# Patient Record
Sex: Male | Born: 1980 | Race: Black or African American | Hispanic: No | Marital: Single | Smoking: Current some day smoker
Health system: Southern US, Community
[De-identification: ages and names within clinical notes are randomized; demographics above are authoritative.]

## PROBLEM LIST (undated history)

## (undated) DIAGNOSIS — G4730 Sleep apnea, unspecified: Secondary | ICD-10-CM

## (undated) DIAGNOSIS — E669 Obesity, unspecified: Secondary | ICD-10-CM

## (undated) DIAGNOSIS — G47419 Narcolepsy without cataplexy: Secondary | ICD-10-CM

## (undated) HISTORY — PX: NO PAST SURGERIES: SHX2092

---

## 2000-03-31 ENCOUNTER — Emergency Department (HOSPITAL_COMMUNITY): Admission: EM | Admit: 2000-03-31 | Discharge: 2000-03-31 | Payer: Self-pay | Admitting: *Deleted

## 2000-04-03 ENCOUNTER — Emergency Department (HOSPITAL_COMMUNITY): Admission: EM | Admit: 2000-04-03 | Discharge: 2000-04-03 | Payer: Self-pay | Admitting: Emergency Medicine

## 2001-01-16 ENCOUNTER — Encounter: Payer: Self-pay | Admitting: Emergency Medicine

## 2001-01-16 ENCOUNTER — Emergency Department (HOSPITAL_COMMUNITY): Admission: EM | Admit: 2001-01-16 | Discharge: 2001-01-16 | Payer: Self-pay | Admitting: Emergency Medicine

## 2004-06-10 ENCOUNTER — Emergency Department (HOSPITAL_COMMUNITY): Admission: EM | Admit: 2004-06-10 | Discharge: 2004-06-10 | Payer: Self-pay | Admitting: *Deleted

## 2005-01-07 ENCOUNTER — Emergency Department (HOSPITAL_COMMUNITY): Admission: EM | Admit: 2005-01-07 | Discharge: 2005-01-07 | Payer: Self-pay | Admitting: Emergency Medicine

## 2005-02-27 ENCOUNTER — Emergency Department (HOSPITAL_COMMUNITY): Admission: EM | Admit: 2005-02-27 | Discharge: 2005-02-27 | Payer: Self-pay | Admitting: Emergency Medicine

## 2006-12-24 ENCOUNTER — Emergency Department (HOSPITAL_COMMUNITY): Admission: EM | Admit: 2006-12-24 | Discharge: 2006-12-24 | Payer: Self-pay | Admitting: Family Medicine

## 2007-02-23 ENCOUNTER — Emergency Department (HOSPITAL_COMMUNITY): Admission: EM | Admit: 2007-02-23 | Discharge: 2007-02-23 | Payer: Self-pay | Admitting: Family Medicine

## 2007-05-25 ENCOUNTER — Emergency Department (HOSPITAL_COMMUNITY): Admission: EM | Admit: 2007-05-25 | Discharge: 2007-05-25 | Payer: Self-pay | Admitting: Emergency Medicine

## 2007-08-27 ENCOUNTER — Emergency Department (HOSPITAL_COMMUNITY): Admission: EM | Admit: 2007-08-27 | Discharge: 2007-08-27 | Payer: Self-pay | Admitting: Emergency Medicine

## 2007-12-17 ENCOUNTER — Emergency Department (HOSPITAL_COMMUNITY): Admission: EM | Admit: 2007-12-17 | Discharge: 2007-12-17 | Payer: Self-pay | Admitting: Emergency Medicine

## 2008-02-01 ENCOUNTER — Encounter: Admission: RE | Admit: 2008-02-01 | Discharge: 2008-02-01 | Payer: Self-pay | Admitting: Orthopedic Surgery

## 2009-04-03 ENCOUNTER — Emergency Department (HOSPITAL_COMMUNITY): Admission: EM | Admit: 2009-04-03 | Discharge: 2009-04-03 | Payer: Self-pay | Admitting: Family Medicine

## 2010-12-02 ENCOUNTER — Encounter: Payer: Self-pay | Admitting: Orthopedic Surgery

## 2014-08-16 ENCOUNTER — Ambulatory Visit: Payer: Self-pay

## 2015-04-03 ENCOUNTER — Emergency Department (HOSPITAL_COMMUNITY)

## 2015-04-03 ENCOUNTER — Encounter (HOSPITAL_COMMUNITY): Payer: Self-pay | Admitting: Emergency Medicine

## 2015-04-03 ENCOUNTER — Emergency Department (HOSPITAL_COMMUNITY)
Admission: EM | Admit: 2015-04-03 | Discharge: 2015-04-03 | Disposition: A | Discharge status: Home / Self Care | Attending: Emergency Medicine | Admitting: Emergency Medicine

## 2015-04-03 DIAGNOSIS — E669 Obesity, unspecified: Secondary | ICD-10-CM | POA: Diagnosis not present

## 2015-04-03 DIAGNOSIS — G4730 Sleep apnea, unspecified: Secondary | ICD-10-CM

## 2015-04-03 HISTORY — DX: Sleep apnea, unspecified: G47.30

## 2015-04-03 LAB — CBC WITH DIFFERENTIAL/PLATELET
BASOS ABS: 0.1 10*3/uL (ref 0.0–0.1)
BASOS PCT: 1 % (ref 0–1)
EOS PCT: 2 % (ref 0–5)
Eosinophils Absolute: 0.1 10*3/uL (ref 0.0–0.7)
HEMATOCRIT: 45.7 % (ref 39.0–52.0)
Hemoglobin: 14.7 g/dL (ref 13.0–17.0)
Lymphocytes Relative: 33 % (ref 12–46)
Lymphs Abs: 1.6 10*3/uL (ref 0.7–4.0)
MCH: 29.3 pg (ref 26.0–34.0)
MCHC: 32.2 g/dL (ref 30.0–36.0)
MCV: 91 fL (ref 78.0–100.0)
Monocytes Absolute: 0.4 10*3/uL (ref 0.1–1.0)
Monocytes Relative: 9 % (ref 3–12)
NEUTROS PCT: 55 % (ref 43–77)
Neutro Abs: 2.7 10*3/uL (ref 1.7–7.7)
Platelets: 272 10*3/uL (ref 150–400)
RBC: 5.02 MIL/uL (ref 4.22–5.81)
RDW: 14.4 % (ref 11.5–15.5)
WBC: 4.8 10*3/uL (ref 4.0–10.5)

## 2015-04-03 LAB — COMPREHENSIVE METABOLIC PANEL
ALBUMIN: 4.1 g/dL (ref 3.5–5.0)
ALT: 15 U/L — AB (ref 17–63)
ANION GAP: 7 (ref 5–15)
AST: 18 U/L (ref 15–41)
Alkaline Phosphatase: 49 U/L (ref 38–126)
BUN: 13 mg/dL (ref 6–20)
CALCIUM: 9.2 mg/dL (ref 8.9–10.3)
CHLORIDE: 101 mmol/L (ref 101–111)
CO2: 32 mmol/L (ref 22–32)
CREATININE: 1.17 mg/dL (ref 0.61–1.24)
GFR calc non Af Amer: >60 mL/min (ref >60)
Glucose, Bld: 89 mg/dL (ref 65–99)
Potassium: 4.1 mmol/L (ref 3.5–5.1)
SODIUM: 140 mmol/L (ref 135–145)
TOTAL PROTEIN: 7.9 g/dL (ref 6.5–8.1)
Total Bilirubin: 0.5 mg/dL (ref 0.3–1.2)

## 2015-04-03 LAB — URINALYSIS, ROUTINE W REFLEX MICROSCOPIC
GLUCOSE, UA: NEGATIVE mg/dL
Hgb urine dipstick: NEGATIVE
Ketones, ur: NEGATIVE mg/dL
Leukocytes, UA: NEGATIVE
Nitrite: NEGATIVE
Protein, ur: NEGATIVE mg/dL
Specific Gravity, Urine: 1.028 (ref 1.005–1.030)
UROBILINOGEN UA: 1 mg/dL (ref 0.0–1.0)
pH: 6 (ref 5.0–8.0)

## 2015-04-03 LAB — RAPID URINE DRUG SCREEN, HOSP PERFORMED
AMPHETAMINES: NOT DETECTED
BENZODIAZEPINES: NOT DETECTED
Barbiturates: NOT DETECTED
Cocaine: POSITIVE — AB
OPIATES: NOT DETECTED
Tetrahydrocannabinol: NOT DETECTED

## 2015-04-03 LAB — ETHANOL: Alcohol, Ethyl (B): <5 mg/dL (ref <5)

## 2015-04-03 MED ORDER — NALOXONE HCL 0.4 MG/ML IJ SOLN
0.2000 mg | Freq: Once | INTRAMUSCULAR | Status: DC
  Filled 2015-04-03: qty 1

## 2015-04-03 NOTE — ED Provider Notes (Signed)
CSN: [id number]     Arrival date & time 04/03/15  1211 History   First MD Initiated Contact with Patient 04/03/15 1310     Chief Complaint  Patient presents with  . Sleep Apnea     (Consider location/radiation/quality/duration/timing/severity/associated sxs/prior Treatment) The history is provided by the patient.   patient presents with decreased responsiveness. Began after sleeping. Patient states he has a history of sleep apnea and has been off his Cpap for a couple years. He is also put on 130 pounds the last few years. He does snore. He states this is typical for him because he gets sleepy because he does not sleep much. He denies substance abuse. Denies headache. States he previously had C Pap and finished up 2 years ago. He states he has been in jail and that's when he was diagnosed with it. Her only in jail on the weekends. Does not primary care doctor.  Past Medical History  Diagnosis Date  . Sleep apnea    History reviewed. No pertinent past surgical history. No family history on file. History  Substance Use Topics  . Smoking status: Not on file  . Smokeless tobacco: Not on file  . Alcohol Use: Not on file    Review of Systems  Constitutional: Negative for activity change and appetite change.  Respiratory: Negative for shortness of breath.   Gastrointestinal: Negative for abdominal pain.  Musculoskeletal: Negative for back pain.  Skin: Negative for wound.  Neurological: Negative for speech difficulty and light-headedness.  Hematological: Does not bruise/bleed easily.  Psychiatric/Behavioral: Negative for agitation.      Allergies  Review of patient's allergies indicates no known allergies.  Home Medications   Prior to Admission medications   Not on File   BP 122/63 mmHg  Pulse 79  Temp(Src) 98 F (36.7 C) (Oral)  Resp 21  SpO2 97% Physical Exam  Constitutional: He is oriented to person, place, and time. He appears well-developed and well-nourished.   Patient is obese  HENT:  Head: Atraumatic.  Eyes: EOM are normal.  Neck: Neck supple.  Cardiovascular: Normal rate.   Pulmonary/Chest: Effort normal. He exhibits no tenderness.  Abdominal: Soft. There is no tenderness.  Musculoskeletal: Normal range of motion.  Neurological: He is alert and oriented to person, place, and time.  Patient is initially sleepy but does arouse. He will quickly go back to sleep. Pupils initially somewhat constricted but improved on recheck.    ED Course  Procedures (including critical care time) Labs Review Labs Reviewed  COMPREHENSIVE METABOLIC PANEL - Abnormal; Notable for the following:    ALT 15 (*)    All other components within normal limits  URINE RAPID DRUG SCREEN (HOSP PERFORMED) - Abnormal; Notable for the following:    Cocaine POSITIVE (*)    All other components within normal limits  URINALYSIS, ROUTINE W REFLEX MICROSCOPIC - Abnormal; Notable for the following:    Color, Urine AMBER (*)    Bilirubin Urine SMALL (*)    All other components within normal limits  ETHANOL  CBC WITH DIFFERENTIAL/PLATELET    Imaging Review Dg Chest 2 View  04/03/2015   CLINICAL DATA:  Unresponsiveness when staff attempted to rows from sleep, currently asymptomatic, active smoker  EXAM: CHEST  2 VIEW  COMPARISON:  None.  FINDINGS: The lungs are mildly hypoinflated but clear. The cardiac silhouette is top-normal in size. The pulmonary vascularity is not engorged. There is no pleural effusion. The trachea is midline. The bony thorax exhibits no acute  abnormality.  IMPRESSION: There is no active cardiopulmonary disease.   Electronically Signed   By: David  SwazilandJordan M.D.   On: 04/03/2015 14:48     EKG Interpretation None      MDM   Final diagnoses:  Sleep apnea    Patient with decreased responsiveness. Does awake. Likely secondary to his sleep apnea. Urinalysis only showed cocaine. No focal findings. Pupils initially somewhat constricted but when he woke up  they had returned to normal. Discuss with pulmonary. Will need outpatient workup and sleep study. Case management seen and gave some follow-up resources. Patient does not have his own PCP. He is currently staying in jail on Sunday and Monday night. Will discharge back to jail.    Benjiman CoreNathan Teigen Parslow, MD 04/03/15 [phone]

## 2015-04-03 NOTE — ED Notes (Signed)
Per ems pt from jail, per staff pt was unresponsive while sleeping . Per ems pt was sitting in chair upon arrival, with non-breather mask. O2 sat on RA 98%. Pt is no distress , denies any symptoms. No chest pain nor sob. Pt is lalert and oriented x4.

## 2015-04-03 NOTE — ED Notes (Signed)
Bed: WA02 Expected date:  Expected time:  Means of arrival:  Comments: "unresponsive" from jail (A&O x 4)

## 2015-04-03 NOTE — Discharge Instructions (Signed)
Sleep Apnea  Sleep apnea is a sleep disorder characterized by abnormal pauses in breathing while you sleep. When your breathing pauses, the level of oxygen in your blood decreases. This causes you to move out of deep sleep and into light sleep. As a result, your quality of sleep is poor, and the system that carries your blood throughout your body (cardiovascular system) experiences stress. If sleep apnea remains untreated, the following conditions can develop:  High blood pressure (hypertension).  Coronary artery disease.  Inability to achieve or maintain an erection (impotence).  Impairment of your thought process (cognitive dysfunction). There are three types of sleep apnea: 1. Obstructive sleep apnea--Pauses in breathing during sleep because of a blocked airway. 2. Central sleep apnea--Pauses in breathing during sleep because the area of the brain that controls your breathing does not send the correct signals to the muscles that control breathing. 3. Mixed sleep apnea--A combination of both obstructive and central sleep apnea. RISK FACTORS The following risk factors can increase your risk of developing sleep apnea:  Being overweight.  Smoking.  Having narrow passages in your nose and throat.  Being of older age.  Being male.  Alcohol use.  Sedative and tranquilizer use.  Ethnicity. Among individuals younger than 35 years, African Americans are at increased risk of sleep apnea. SYMPTOMS   Difficulty staying asleep.  Daytime sleepiness and fatigue.  Loss of energy.  Irritability.  Loud, heavy snoring.  Morning headaches.  Trouble concentrating.  Forgetfulness.  Decreased interest in sex. DIAGNOSIS  In order to diagnose sleep apnea, your caregiver will perform a physical examination. Your caregiver may suggest that you take a home sleep test. Your caregiver may also recommend that you spend the night in a sleep lab. In the sleep lab, several monitors record  information about your heart, lungs, and brain while you sleep. Your leg and arm movements and blood oxygen level are also recorded. TREATMENT The following actions may help to resolve mild sleep apnea:  Sleeping on your side.   Using a decongestant if you have nasal congestion.   Avoiding the use of depressants, including alcohol, sedatives, and narcotics.   Losing weight and modifying your diet if you are overweight. There also are devices and treatments to help open your airway:  Oral appliances. These are custom-made mouthpieces that shift your lower jaw forward and slightly open your bite. This opens your airway.  Devices that create positive airway pressure. This positive pressure "splints" your airway open to help you breathe better during sleep. The following devices create positive airway pressure:  Continuous positive airway pressure (CPAP) device. The CPAP device creates a continuous level of air pressure with an air pump. The air is delivered to your airway through a mask while you sleep. This continuous pressure keeps your airway open.  Nasal expiratory positive airway pressure (EPAP) device. The EPAP device creates positive air pressure as you exhale. The device consists of single-use valves, which are inserted into each nostril and held in place by adhesive. The valves create very little resistance when you inhale but create much more resistance when you exhale. That increased resistance creates the positive airway pressure. This positive pressure while you exhale keeps your airway open, making it easier to breath when you inhale again.  Bilevel positive airway pressure (BPAP) device. The BPAP device is used mainly in patients with central sleep apnea. This device is similar to the CPAP device because it also uses an air pump to deliver continuous air pressure   through a mask. However, with the BPAP machine, the pressure is set at two different levels. The pressure when you  exhale is lower than the pressure when you inhale.  Surgery. Typically, surgery is only done if you cannot comply with less invasive treatments or if the less invasive treatments do not improve your condition. Surgery involves removing excess tissue in your airway to create a wider passage way. Document Released: 10/18/2002 Document Revised: 02/22/2013 Document Reviewed: 03/05/2012 ExitCare Patient Information 2015 [employer]. This information is not intended to replace advice given to you by your health care provider. Make sure you discuss any questions you have with your health care provider.  

## 2015-04-03 NOTE — Progress Notes (Addendum)
wl ed cm consulted by Dr Rubin PayorPickering to see pt to assist with giving self pay/uninsured resources so when this 34 yr old male [place] and use of jail cpap he can obtain a pcp to get medical care and referral from a pcp to get a sleep study completed to get a cpap if needed. Cm provided pt with self pay/uninsured [place] resources.  Discussed he will need to get a pcp, pcp referral for another sleep study and then get a new cpap Pt voiced understanding Pt confirmed in 2010 he had a sleep study done during another prison term and was given a cpap to use only during that prison stay. Pt was not given that cpap to use outside of prison.   CM spoke with pt who [place].  CM discussed and provided written information for self pay pcps, discussed the importance of pcp vs EDP services for f/u care, www.needymeds.org, www.goodrx.com, discounted pharmacies and other [place] resources such as [facility] , [facility], affordable care act,  [place] [facility] assist, financial assistance, self pay dental services, [place] med assist, DSS and  health department  Reviewed resources for [place] self pay pcps like [facility], family medicine at [address], [facility], [facility], local urgent care centers, [facility], [facility], general medical clinics, [facility] services of the [place], [facility] urgent [facility] plus others, medication resources, [facility] out patient pharmacies and housing Pt voiced understanding and appreciation of resources provided   Provided [facility] contact information Pt agreed to [facility] referral Conf[place], has not had an orange card but knows someone who does.  Pt dozing in and out during Cm interventions.  Officer at doorway Pt confirms he is to be released from jail "tomorrow"  Encouraged pt to go to one of the listed self pay providers listed to  include [facility] until he is possibly approved for orange card program  Pt c/o right occipital iv site pain ED RN notified

## 2015-07-27 ENCOUNTER — Inpatient Hospital Stay (HOSPITAL_COMMUNITY): Payer: Medicaid Other

## 2015-07-27 ENCOUNTER — Emergency Department (HOSPITAL_COMMUNITY): Payer: Medicaid Other

## 2015-07-27 ENCOUNTER — Inpatient Hospital Stay (HOSPITAL_COMMUNITY)
Admission: EM | Admit: 2015-07-27 | Discharge: 2015-08-01 | DRG: 208 | Disposition: A | Discharge status: Home / Self Care | Payer: Medicaid Other | Attending: Emergency Medicine | Admitting: Emergency Medicine

## 2015-07-27 DIAGNOSIS — J9622 Acute and chronic respiratory failure with hypercapnia: Secondary | ICD-10-CM | POA: Diagnosis present

## 2015-07-27 DIAGNOSIS — E874 Mixed disorder of acid-base balance: Secondary | ICD-10-CM | POA: Diagnosis present

## 2015-07-27 DIAGNOSIS — J9612 Chronic respiratory failure with hypercapnia: Secondary | ICD-10-CM | POA: Insufficient documentation

## 2015-07-27 DIAGNOSIS — N179 Acute kidney failure, unspecified: Secondary | ICD-10-CM | POA: Diagnosis not present

## 2015-07-27 DIAGNOSIS — G47419 Narcolepsy without cataplexy: Secondary | ICD-10-CM | POA: Diagnosis present

## 2015-07-27 DIAGNOSIS — I272 Other secondary pulmonary hypertension: Secondary | ICD-10-CM | POA: Diagnosis present

## 2015-07-27 DIAGNOSIS — R5383 Other fatigue: Secondary | ICD-10-CM | POA: Diagnosis present

## 2015-07-27 DIAGNOSIS — R739 Hyperglycemia, unspecified: Secondary | ICD-10-CM | POA: Diagnosis present

## 2015-07-27 DIAGNOSIS — E7220 Disorder of urea cycle metabolism, unspecified: Secondary | ICD-10-CM | POA: Diagnosis present

## 2015-07-27 DIAGNOSIS — J9621 Acute and chronic respiratory failure with hypoxia: Secondary | ICD-10-CM | POA: Diagnosis present

## 2015-07-27 DIAGNOSIS — Z6841 Body Mass Index (BMI) 40.0 and over, adult: Secondary | ICD-10-CM | POA: Diagnosis not present

## 2015-07-27 DIAGNOSIS — G4733 Obstructive sleep apnea (adult) (pediatric): Secondary | ICD-10-CM | POA: Diagnosis present

## 2015-07-27 DIAGNOSIS — F1410 Cocaine abuse, uncomplicated: Secondary | ICD-10-CM | POA: Diagnosis present

## 2015-07-27 DIAGNOSIS — G9340 Encephalopathy, unspecified: Secondary | ICD-10-CM | POA: Diagnosis present

## 2015-07-27 DIAGNOSIS — I10 Essential (primary) hypertension: Secondary | ICD-10-CM | POA: Diagnosis present

## 2015-07-27 DIAGNOSIS — R945 Abnormal results of liver function studies: Secondary | ICD-10-CM

## 2015-07-27 DIAGNOSIS — I491 Atrial premature depolarization: Secondary | ICD-10-CM | POA: Diagnosis present

## 2015-07-27 DIAGNOSIS — J9600 Acute respiratory failure, unspecified whether with hypoxia or hypercapnia: Secondary | ICD-10-CM

## 2015-07-27 DIAGNOSIS — E662 Morbid (severe) obesity with alveolar hypoventilation: Secondary | ICD-10-CM | POA: Diagnosis present

## 2015-07-27 DIAGNOSIS — E876 Hypokalemia: Secondary | ICD-10-CM | POA: Diagnosis not present

## 2015-07-27 DIAGNOSIS — J9620 Acute and chronic respiratory failure, unspecified whether with hypoxia or hypercapnia: Secondary | ICD-10-CM

## 2015-07-27 DIAGNOSIS — R7989 Other specified abnormal findings of blood chemistry: Secondary | ICD-10-CM

## 2015-07-27 LAB — I-STAT ARTERIAL BLOOD GAS, ED
ACID-BASE EXCESS: 2 mmol/L (ref 0.0–2.0)
ACID-BASE EXCESS: 4 mmol/L — AB (ref 0.0–2.0)
BICARBONATE: 33 meq/L — AB (ref 20.0–24.0)
Bicarbonate: 29.8 mEq/L — ABNORMAL HIGH (ref 20.0–24.0)
O2 SAT: 94 %
O2 SAT: 100 %
PCO2 ART: 56.7 mmHg — AB (ref 35.0–45.0)
PH ART: 7.33 — AB (ref 7.350–7.450)
Patient temperature: 98.6
TCO2: 32 mmol/L (ref 0–100)
TCO2: 35 mmol/L (ref 0–100)
pCO2 arterial: 68.9 mmHg (ref 35.0–45.0)
pH, Arterial: 7.288 — ABNORMAL LOW (ref 7.350–7.450)
pO2, Arterial: 77 mmHg — ABNORMAL LOW (ref 80.0–100.0)
pO2, Arterial: 382 mmHg — ABNORMAL HIGH (ref 80.0–100.0)

## 2015-07-27 LAB — RAPID URINE DRUG SCREEN, HOSP PERFORMED
AMPHETAMINES: NOT DETECTED
Barbiturates: NOT DETECTED
Benzodiazepines: POSITIVE — AB
Cocaine: POSITIVE — AB
OPIATES: NOT DETECTED
Tetrahydrocannabinol: NOT DETECTED

## 2015-07-27 LAB — CBC
HCT: 43.9 % (ref 39.0–52.0)
HEMOGLOBIN: 14.7 g/dL (ref 13.0–17.0)
MCH: 29.9 pg (ref 26.0–34.0)
MCHC: 33.5 g/dL (ref 30.0–36.0)
MCV: 89.4 fL (ref 78.0–100.0)
PLATELETS: 262 10*3/uL (ref 150–400)
RBC: 4.91 MIL/uL (ref 4.22–5.81)
RDW: 13.5 % (ref 11.5–15.5)
WBC: 4.1 10*3/uL (ref 4.0–10.5)

## 2015-07-27 LAB — CBC WITH DIFFERENTIAL/PLATELET
BASOS ABS: 0 10*3/uL (ref 0.0–0.1)
BASOS PCT: 1 %
EOS ABS: 0.2 10*3/uL (ref 0.0–0.7)
Eosinophils Relative: 3 %
HEMATOCRIT: 45.6 % (ref 39.0–52.0)
HEMOGLOBIN: 15 g/dL (ref 13.0–17.0)
Lymphocytes Relative: 36 %
Lymphs Abs: 1.9 10*3/uL (ref 0.7–4.0)
MCH: 29.8 pg (ref 26.0–34.0)
MCHC: 32.9 g/dL (ref 30.0–36.0)
MCV: 90.7 fL (ref 78.0–100.0)
MONOS PCT: 11 %
Monocytes Absolute: 0.6 10*3/uL (ref 0.1–1.0)
NEUTROS ABS: 2.6 10*3/uL (ref 1.7–7.7)
NEUTROS PCT: 49 %
Platelets: 274 10*3/uL (ref 150–400)
RBC: 5.03 MIL/uL (ref 4.22–5.81)
RDW: 13.8 % (ref 11.5–15.5)
WBC: 5.3 10*3/uL (ref 4.0–10.5)

## 2015-07-27 LAB — COMPREHENSIVE METABOLIC PANEL
ALBUMIN: 4 g/dL (ref 3.5–5.0)
ALK PHOS: 45 U/L (ref 38–126)
ALT: 18 U/L (ref 17–63)
ANION GAP: 7 (ref 5–15)
AST: 24 U/L (ref 15–41)
BILIRUBIN TOTAL: 0.5 mg/dL (ref 0.3–1.2)
BUN: 6 mg/dL (ref 6–20)
CALCIUM: 9.1 mg/dL (ref 8.9–10.3)
CO2: 28 mmol/L (ref 22–32)
CREATININE: 1.03 mg/dL (ref 0.61–1.24)
Chloride: 105 mmol/L (ref 101–111)
GFR calc Af Amer: >60 mL/min (ref >60)
GFR calc non Af Amer: >60 mL/min (ref >60)
GLUCOSE: 120 mg/dL — AB (ref 65–99)
Potassium: 3.8 mmol/L (ref 3.5–5.1)
Sodium: 140 mmol/L (ref 135–145)
TOTAL PROTEIN: 7.1 g/dL (ref 6.5–8.1)

## 2015-07-27 LAB — CBG MONITORING, ED
GLUCOSE-CAPILLARY: 121 mg/dL — AB (ref 65–99)
GLUCOSE-CAPILLARY: 129 mg/dL — AB (ref 65–99)

## 2015-07-27 LAB — MRSA PCR SCREENING: MRSA by PCR: NEGATIVE

## 2015-07-27 LAB — ETHANOL

## 2015-07-27 LAB — LIPASE, BLOOD: LIPASE: 26 U/L (ref 22–51)

## 2015-07-27 LAB — TROPONIN I
Troponin I: <0.03 ng/mL (ref <0.031)
Troponin I: <0.03 ng/mL (ref <0.031)

## 2015-07-27 LAB — TRIGLYCERIDES: TRIGLYCERIDES: 182 mg/dL — AB (ref <150)

## 2015-07-27 LAB — AMMONIA: AMMONIA: 51 umol/L — AB (ref 9–35)

## 2015-07-27 MED ORDER — HEPARIN SODIUM (PORCINE) 5000 UNIT/ML IJ SOLN
5000.0000 [IU] | Freq: Three times a day (TID) | INTRAMUSCULAR | Status: DC
  Administered 2015-07-27 – 2015-07-30 (×10): 5000 [IU] via SUBCUTANEOUS
  Filled 2015-07-27 (×13): qty 1

## 2015-07-27 MED ORDER — SODIUM CHLORIDE 0.9 % IV SOLN: 250.0000 mL | INTRAVENOUS | Status: DC | PRN

## 2015-07-27 MED ORDER — ANTISEPTIC ORAL RINSE SOLUTION (CORINZ)
7.0000 mL | Freq: Four times a day (QID) | OROMUCOSAL | Status: DC
  Administered 2015-07-27 – 2015-07-28 (×5): 7 mL via OROMUCOSAL

## 2015-07-27 MED ORDER — LACTULOSE 10 GM/15ML PO SOLN
30.0000 g | Freq: Two times a day (BID) | ORAL | Status: DC
  Administered 2015-07-27 – 2015-07-30 (×8): 30 g via ORAL
  Filled 2015-07-27 (×8): qty 45

## 2015-07-27 MED ORDER — ETOMIDATE 2 MG/ML IV SOLN
20.0000 mg | Freq: Once | INTRAVENOUS | Status: AC
  Administered 2015-07-27: 20 mg via INTRAVENOUS

## 2015-07-27 MED ORDER — ALBUTEROL SULFATE (2.5 MG/3ML) 0.083% IN NEBU: 2.5000 mg | INHALATION_SOLUTION | RESPIRATORY_TRACT | Status: DC | PRN

## 2015-07-27 MED ORDER — PROPOFOL 1000 MG/100ML IV EMUL
0.0000 ug/kg/min | INTRAVENOUS | Status: DC
  Administered 2015-07-27: 50 ug/kg/min via INTRAVENOUS
  Administered 2015-07-27: 45 ug/kg/min via INTRAVENOUS
  Administered 2015-07-27 – 2015-07-28 (×4): 50 ug/kg/min via INTRAVENOUS
  Administered 2015-07-28 (×2): 45 ug/kg/min via INTRAVENOUS
  Filled 2015-07-27 (×8): qty 100

## 2015-07-27 MED ORDER — SODIUM CHLORIDE 0.9 % IV SOLN: INTRAVENOUS | Status: DC

## 2015-07-27 MED ORDER — PROPOFOL 1000 MG/100ML IV EMUL
0.0000 ug/kg/min | INTRAVENOUS | Status: DC
  Administered 2015-07-27: 20 ug/kg/min via INTRAVENOUS

## 2015-07-27 MED ORDER — CHLORHEXIDINE GLUCONATE 0.12% ORAL RINSE (MEDLINE KIT)
15.0000 mL | Freq: Two times a day (BID) | OROMUCOSAL | Status: DC
Start: 2015-07-27 — End: 2015-07-28
  Administered 2015-07-27 – 2015-07-28 (×2): 15 mL via OROMUCOSAL

## 2015-07-27 MED ORDER — FENTANYL CITRATE (PF) 100 MCG/2ML IJ SOLN
INTRAMUSCULAR | Status: AC
  Filled 2015-07-27: qty 2

## 2015-07-27 MED ORDER — MIDAZOLAM HCL 2 MG/2ML IJ SOLN
INTRAMUSCULAR | Status: AC
  Filled 2015-07-27: qty 2

## 2015-07-27 MED ORDER — LIDOCAINE HCL (CARDIAC) 20 MG/ML IV SOLN
INTRAVENOUS | Status: AC
  Filled 2015-07-27: qty 5

## 2015-07-27 MED ORDER — PANTOPRAZOLE SODIUM 40 MG IV SOLR
40.0000 mg | Freq: Every day | INTRAVENOUS | Status: DC
  Administered 2015-07-27: 40 mg via INTRAVENOUS
  Filled 2015-07-27: qty 40

## 2015-07-27 MED ORDER — FENTANYL CITRATE (PF) 100 MCG/2ML IJ SOLN
100.0000 ug | INTRAMUSCULAR | Status: AC | PRN
  Administered 2015-07-27 – 2015-07-28 (×4): 100 ug via INTRAVENOUS
  Filled 2015-07-27 (×3): qty 2

## 2015-07-27 MED ORDER — ROCURONIUM BROMIDE 50 MG/5ML IV SOLN
INTRAVENOUS | Status: AC
  Filled 2015-07-27: qty 2

## 2015-07-27 MED ORDER — FENTANYL CITRATE (PF) 100 MCG/2ML IJ SOLN
100.0000 ug | INTRAMUSCULAR | Status: DC | PRN
  Filled 2015-07-27: qty 2

## 2015-07-27 MED ORDER — PROPOFOL 1000 MG/100ML IV EMUL
INTRAVENOUS | Status: AC
  Filled 2015-07-27: qty 100

## 2015-07-27 MED ORDER — MIDAZOLAM HCL 2 MG/2ML IJ SOLN: 2.0000 mg | INTRAMUSCULAR | Status: DC | PRN

## 2015-07-27 MED ORDER — MIDAZOLAM HCL 2 MG/2ML IJ SOLN
2.0000 mg | INTRAMUSCULAR | Status: DC | PRN
  Administered 2015-07-27 (×2): 2 mg via INTRAVENOUS
  Filled 2015-07-27 (×2): qty 2

## 2015-07-27 MED ORDER — ETOMIDATE 2 MG/ML IV SOLN
INTRAVENOUS | Status: AC
Start: 2015-07-27 — End: 2015-07-27
  Administered 2015-07-27: 20 mg
  Filled 2015-07-27: qty 20

## 2015-07-27 MED ORDER — FENTANYL CITRATE (PF) 100 MCG/2ML IJ SOLN
100.0000 ug | Freq: Once | INTRAMUSCULAR | Status: AC
  Administered 2015-07-27: 100 ug via INTRAVENOUS

## 2015-07-27 MED ORDER — SUCCINYLCHOLINE CHLORIDE 20 MG/ML IJ SOLN
INTRAMUSCULAR | Status: AC
  Filled 2015-07-27: qty 1

## 2015-07-27 MED ORDER — MIDAZOLAM HCL 2 MG/2ML IJ SOLN
2.0000 mg | Freq: Once | INTRAMUSCULAR | Status: AC
  Administered 2015-07-27: 2 mg via INTRAVENOUS

## 2015-07-27 NOTE — H&P (Signed)
Name: Darryl Moody MRN: [id number] DOB: June 16, 1981    ADMISSION DATE:  07/27/2015 CONSULTATION DATE:  9/15 REFERRING MD :  Gwendolyn Grant   CHIEF COMPLAINT:  Acute encephalopathy   BRIEF PATIENT DESCRIPTION:  34 year old male w/ h/o narcolepsy and untreated OSA, presents to the on 9/15 ER w/ acute encephalopathy which was out of proportion to baseline.   SIGNIFICANT EVENTS    STUDIES:  UDS 9/15>>> CT head 9/15>>>   HISTORY OF PRESENT ILLNESS:   This is a 34 year old obese male w/ previously diagnosed sleep apnea (diagnosed in prison three years ago), and narcolepsy. Presents to the ER on 9/15 w/ 2 d h/o excess fatigue and altered mental status. Wife reports has narcolepsy at baseline. Often will fall asleep at any point during the day. States that over the last two days prior to admit will go to sleep and can not be woken up. EMS called. On arrival his O2 sats were in 90s on room air but would dip down in to the 70s w/ his 20 second periods of apnea. These were also associated w/ freq PVCs. An arterial blood gas was obtained suggesting a chronic hypercarbic respiratory failure. The only other initial pert positive was an elevated ammonia. PCCM asked to admit.   PAST MEDICAL HISTORY :   has a past medical history of Sleep apnea.  has no past surgical history on file. Prior to Admission medications   Not on File   No Known Allergies  FAMILY HISTORY:  family history is not on file.  unknown SOCIAL HISTORY:   + smoker, denies current drug use. Incarcerated 3 yrs prior. Not working. Married  REVIEW OF SYSTEMS:   Constitutional: Negative for fever, chills, + weight gain , malaise/fatigue and diaphoresis.  HENT: Negative for hearing loss, ear pain, nosebleeds, congestion, sore throat, neck pain, tinnitus and ear discharge.   Eyes: Negative for blurred vision, double vision, photophobia, pain, discharge and redness.  Respiratory: Negative for cough, hemoptysis, sputum production,  shortness of breath, wheezing and stridor.   Cardiovascular: Negative for chest pain, palpitations, orthopnea, claudication, leg swelling and PND.  Gastrointestinal: Negative for heartburn, nausea, vomiting, abdominal pain, diarrhea, constipation, blood in stool and melena.  Genitourinary: Negative for dysuria, urgency, frequency, hematuria and flank pain.  Musculoskeletal: Negative for myalgias, back pain, joint pain and falls.  Skin: Negative for itching and rash.  Neurological: Negative for dizziness, tingling, tremors, sensory change, speech change, difficult to awaken, focal weakness, seizures, loss of consciousness, weakness and headaches.  Endo/Heme/Allergies: Negative for environmental allergies and polydipsia. Does not bruise/bleed easily.  SUBJECTIVE: More awake on CPAP  VITAL SIGNS: Temp:  [98.4 F (36.9 C)] 98.4 F (36.9 C) (09/15 1152) Pulse Rate:  [57-106] 97 (09/15 1337) Resp:  [18-24] 24 (09/15 1337) BP: (126-158)/(74-87) 158/85 mmHg (09/15 1337) SpO2:  [86 %-99 %] 94 % (09/15 1337)  PHYSICAL EXAMINATION: General:  Obese AAM difficult to arouse, and prior to CPAP would awaken, mumble incoherently and then go back to sleep; having in excess of 20 second periods of apnea.  Neuro:  Oriented X 3 after CPAP placed; moves all extremities but does fall back to sleep  HEENT:  Neck large/massive, tongue large. MM moist  Cardiovascular:  rrr Lungs:  Clear w/ 20 sec periods of apnea  Abdomen:  Obese + bowel sounds  Musculoskeletal:  Intact  Skin:  Warm, dry + LE edema    Recent Labs Lab 07/27/15 1154  NA 140  K  3.8  CL 105  CO2 28  BUN 6  CREATININE 1.03  GLUCOSE 120*    Recent Labs Lab 07/27/15 1154  HGB 14.7  HCT 43.9  WBC 4.1  PLT 262   Ct Head Wo Contrast  07/27/2015   CLINICAL DATA:  Altered mental status, sleep apnea  EXAM: CT HEAD WITHOUT CONTRAST  TECHNIQUE: Contiguous axial images were obtained from the base of the skull through the vertex without  intravenous contrast.  COMPARISON:  None.  FINDINGS: No skull fracture is noted. Paranasal sinuses and mastoid air cells are unremarkable. No intracranial hemorrhage, mass effect or midline shift. No acute cortical infarction. No hydrocephalus. No mass lesion is noted on this unenhanced scan. The gray and white-matter differentiation is preserved.  IMPRESSION: No acute intracranial abnormality.   Electronically Signed   By: Natasha Mead M.D.   On: 07/27/2015 13:33    ASSESSMENT / PLAN: NEURO A: Acute Encephalopathy. Has h/o narcolepsy but his sleepiness and disorientation seems out of proportion to his narcolepsy AND not sure that this can be explained by his hypercarbia as this is chronic based on his ABG. Only abnormal other finding at this point is elevated ammonia.  P: Ck UA F/U UDS Consider EEG Re-assess MS after we get him ventilated / oxygenated  PULMONARY A: Acute on Chronic hypoxic and hypercarbic respiratory failure in the setting of severe untreated sleep apnea and obesity hypoventilation.  Given his exam also concerned about possible secondary PAH, and potentially cor pulmonale. Was on CPAP of 14 cmH2O when in prison. Will start here.  P: Admit to ICU Place BIPAP-->repeat abg, may need vent. If intubated would recommend trach Consult CM to assist w/ resources. Has had sleep study 3 years ago (while in prison).  Get ECHO  CARDIOLOGY: A: Probable PAH freq PACs P: Get ECHO Admit to tele  HEME A: No acute  P: [place] heparin Trend CBC  RENAL A: No acute issues, has chronic metabolic alkalosis P: KVO IVFs F/u chem Renal dose meds  GI: A: Hyperammonemia  P: Add lactulose Repeat in am RUQ [place]  Endocrine A: mild hyperglycemia P: Trend CBC  ID: A: No acute  P: Trend fever and WBC curve   Looks like will need intubation. Likely will need trach/nocturnal vent. Concerned that this is severe untreated apnea and hypoventilation. Not sure that we can adequately  treat this w/ BIPAP based on his appearance on my eval this pm. He is obtunded, in resp distress. He does not wake to any stimulation, desaturates with any activity. I believe he will require intubation. He may ultimately require trach placement +/- nocturnal ventilation.    Simonne Martinet ACNP-BC [facility] Pulmonary/Critical Care Pager # [phone] OR # [phone] if no answer 07/27/2015, 1:51 PM   Attending Note:  I have examined patient, reviewed labs, studies and notes. I have discussed the case with Kreg Shropshire, and I agree with the data and plans as amended above. Patient presents with altered MS, head CT scan reassuring, etiology unclear but suspect due to acute on chronic resp failure. With both hypoxemia and hypercapnia. He has been started on BiPAP prior to my arrival, but Independent critical care time is 90 minutes.   Levy Pupa, MD, PhD 07/27/2015, 4:06 PM [facility] [phone] or if no answer [phone]

## 2015-07-27 NOTE — Procedures (Signed)
Intubation Procedure Note Darryl Moody [id number] 31-Mar-1981  Procedure: Intubation Indications: Respiratory insufficiency  Procedure Details Consent: Risks of procedure as well as the alternatives and risks of each were explained to the (patient/caregiver).  Consent for procedure obtained. Time Out: Verified patient identification, verified procedure, site/side was marked, verified correct patient position, special equipment/implants available, medications/allergies/relevent history reviewed, required imaging and test results available.  Performed  Maximum sterile technique was used including antiseptics, gloves, hand hygiene and mask.  MAC #4 w/ 8 ett    Evaluation Hemodynamic Status: BP stable throughout; O2 sats: stable throughout Patient's Current Condition: stable Complications: No apparent complications Patient did tolerate procedure well. Chest X-ray ordered to verify placement.  CXR: pending.   BABCOCK,PETE 07/27/2015  Levy Pupa, MD, PhD 07/28/2015, 12:57 PM [facility] [phone] or if no answer [phone]

## 2015-07-27 NOTE — Progress Notes (Signed)
UTA abuse data. Pt is currently intubated and sedated.

## 2015-07-27 NOTE — ED Notes (Addendum)
Pt reports he has sleep apnea. BIB by wife who states patient can't wake up to do anything, keeps falling back to sleep. Pt arousable to sternal rubs, converses then falls back to sleep. States he doesn't have a CPAP for home use.

## 2015-07-27 NOTE — ED Notes (Signed)
Pt having periods of apnea when sleeping, wife and mother at bedside, state that pt has a hx of sleep apnea and narcolepsy.  Pt difficult to wake, but will wake for a very short time and answer questions.

## 2015-07-27 NOTE — Progress Notes (Signed)
PICC insertion was requested however we will not be able to insert PICC this evening.   Primary RN informed that it would be tomorrow before PICC could be inserted.

## 2015-07-27 NOTE — ED Notes (Signed)
Critical care here to talk with pt and family.

## 2015-07-27 NOTE — ED Provider Notes (Addendum)
CSN: [id number]     Arrival date & time 07/27/15  1148 History   First MD Initiated Contact with Patient 07/27/15 1204     Chief Complaint  Patient presents with  . Fatigue  . Altered Mental Status     (Consider location/radiation/quality/duration/timing/severity/associated sxs/prior Treatment) HPI Comments: Here with altered mental status. Hx of sleep apnea, doesn't have a CPAP. Family states this lethargy is chronic, but they couldn't wake him up today. No drug use, no falls or trauma.  Patient is a 34 y.o. male presenting with altered mental status. The history is provided by the patient.  Altered Mental Status Presenting symptoms: lethargy and unresponsiveness   Severity:  Moderate Most recent episode:  Today Episode history:  Single Timing:  Constant Progression:  Unchanged Chronicity:  Chronic Context: not a recent illness and not a recent infection   Associated symptoms: no abdominal pain, no fever and no vomiting     Past Medical History  Diagnosis Date  . Sleep apnea    No past surgical history on file. No family history on file. Social History  Substance Use Topics  . Smoking status: Not on file  . Smokeless tobacco: Not on file  . Alcohol Use: Not on file    Review of Systems  Constitutional: Negative for fever.  Respiratory: Negative for cough and shortness of breath.   Gastrointestinal: Negative for vomiting and abdominal pain.  All other systems reviewed and are negative.     Allergies  Review of patient's allergies indicates no known allergies.  Home Medications   Prior to Admission medications   Not on File   BP 126/87 mmHg  Pulse 94  Temp(Src) 98.4 F (36.9 C) (Oral)  Resp 18  SpO2 96% Physical Exam  Constitutional: He is oriented to person, place, and time. He appears well-developed and well-nourished. No distress.  HENT:  Head: Normocephalic and atraumatic.  Mouth/Throat: No oropharyngeal exudate.  Eyes: EOM are normal. Pupils are  equal, round, and reactive to light.  Neck: Normal range of motion. Neck supple.  Cardiovascular: Normal rate and regular rhythm.  Exam reveals no friction rub.   No murmur heard. Pulmonary/Chest: Effort normal and breath sounds normal. No respiratory distress. He has no wheezes. He has no rales.  Abdominal: Soft. He exhibits no distension. There is no tenderness. There is no rebound.  Musculoskeletal: Normal range of motion. He exhibits no edema.  Neurological: He is alert and oriented to person, place, and time.  Somnolent, but awakes to vigorous stimulation. Oriented, follows commands once awake.  Skin: He is not diaphoretic.  Nursing note and vitals reviewed.   ED Course  Procedures (including critical care time) Labs Review Labs Reviewed  CBG MONITORING, ED - Abnormal; Notable for the following:    Glucose-Capillary 129 (*)    All other components within normal limits  CBG MONITORING, ED - Abnormal; Notable for the following:    Glucose-Capillary 121 (*)    All other components within normal limits  CBC  COMPREHENSIVE METABOLIC PANEL  URINE RAPID DRUG SCREEN, HOSP PERFORMED  ETHANOL  AMMONIA    Imaging Review Ct Head Wo Contrast  07/27/2015   CLINICAL DATA:  Altered mental status, sleep apnea  EXAM: CT HEAD WITHOUT CONTRAST  TECHNIQUE: Contiguous axial images were obtained from the base of the skull through the vertex without intravenous contrast.  COMPARISON:  None.  FINDINGS: No skull fracture is noted. Paranasal sinuses and mastoid air cells are unremarkable. No intracranial hemorrhage, mass effect  or midline shift. No acute cortical infarction. No hydrocephalus. No mass lesion is noted on this unenhanced scan. The gray and white-matter differentiation is preserved.  IMPRESSION: No acute intracranial abnormality.   Electronically Signed   By: Natasha Mead M.D.   On: 07/27/2015 13:33   Dg Chest Port 1 View  07/27/2015   CLINICAL DATA:  Patient with apnea.  Narcolepsy.  EXAM:  PORTABLE CHEST - 1 VIEW  COMPARISON:  Chest radiograph 04/03/2015  FINDINGS: Monitoring leads overlie the patient. Low lung volumes. Stable enlarged cardiac and mediastinal contours. No consolidative pulmonary opacities. No pleural effusion or pneumothorax.  IMPRESSION: Low lung volumes.  Cardiomegaly.  No acute cardiopulmonary process.   Electronically Signed   By: Annia Belt M.D.   On: 07/27/2015 14:55   I have personally reviewed and evaluated these images and lab results as part of my medical decision-making.   EKG Interpretation   Date/Time:  Thursday July 27 2015 11:50:15 EDT Ventricular Rate:  94 PR Interval:  162 QRS Duration: 82 QT Interval:  342 QTC Calculation: 427 R Axis:   39 Text Interpretation:  Normal sinus rhythm Nonspecific T wave abnormality  Abnormal ECG No prior for comparison Confirmed by Gwendolyn Grant  MD, Lachlyn Vanderstelt ([id number])  on 07/27/2015 12:27:11 PM      Angiocath insertion Performed by: Dagmar Hait  Consent: Verbal consent obtained. Risks and benefits: risks, benefits and alternatives were discussed Time out: Immediately prior to procedure a "time out" was called to verify the correct patient, procedure, equipment, support staff and site/side marked as required.  Preparation: Patient was prepped and draped in the usual sterile fashion.  Vein Location: R AC  Yes Ultrasound Guided  Gauge: 20   Normal blood return and flush without difficulty Patient tolerance: Patient tolerated the procedure well with no immediate complications.   CRITICAL CARE Performed by: Dagmar Hait   Total critical care time: 30 minutes  Critical care time was exclusive of separately billable procedures and treating other patients.  Critical care was necessary to treat or prevent imminent or life-threatening deterioration.  Critical care was time spent personally by me on the following activities: development of treatment plan with patient and/or surrogate as  well as nursing, discussions with consultants, evaluation of patient's response to treatment, examination of patient, obtaining history from patient or surrogate, ordering and performing treatments and interventions, ordering and review of laboratory studies, ordering and review of radiographic studies, pulse oximetry and re-evaluation of patient's condition.   MDM   Final diagnoses:  Lethargy  Obstructive sleep apnea    29M here with altered mental status. Lethargic, but responds to vigorous stimulation. Awakens to sternal rub, alert to year, follows commands, but goes right back to sleep. Sugar ok, satting ok on room air. He appears to have obstructive sleep apnea while he's breathing. Will check ABG, Head CT, labs. ABG shows some CO2 retention. While watching patient, periods of apnea worsening. I spoke with Critical Care, asked me to hold off on intubation until they assess him.   Critical Care attempted BiPap, will admit.  Elwin Mocha, MD 07/27/15 1542  Elwin Mocha, MD 08/16/15 [phone]

## 2015-07-28 ENCOUNTER — Inpatient Hospital Stay (HOSPITAL_COMMUNITY): Payer: Medicaid Other

## 2015-07-28 DIAGNOSIS — R0600 Dyspnea, unspecified: Secondary | ICD-10-CM

## 2015-07-28 DIAGNOSIS — J9602 Acute respiratory failure with hypercapnia: Secondary | ICD-10-CM

## 2015-07-28 LAB — COMPREHENSIVE METABOLIC PANEL
ALBUMIN: 3.6 g/dL (ref 3.5–5.0)
ALK PHOS: 45 U/L (ref 38–126)
ALT: 17 U/L (ref 17–63)
AST: 27 U/L (ref 15–41)
Anion gap: 9 (ref 5–15)
BILIRUBIN TOTAL: 0.9 mg/dL (ref 0.3–1.2)
BUN: 8 mg/dL (ref 6–20)
CALCIUM: 9.2 mg/dL (ref 8.9–10.3)
CO2: 27 mmol/L (ref 22–32)
CREATININE: 1.27 mg/dL — AB (ref 0.61–1.24)
Chloride: 104 mmol/L (ref 101–111)
GFR calc Af Amer: >60 mL/min (ref >60)
GLUCOSE: 112 mg/dL — AB (ref 65–99)
Potassium: 3.3 mmol/L — ABNORMAL LOW (ref 3.5–5.1)
Sodium: 140 mmol/L (ref 135–145)
TOTAL PROTEIN: 6.7 g/dL (ref 6.5–8.1)

## 2015-07-28 LAB — CBC
HEMATOCRIT: 45.2 % (ref 39.0–52.0)
HEMOGLOBIN: 15.1 g/dL (ref 13.0–17.0)
MCH: 29.8 pg (ref 26.0–34.0)
MCHC: 33.4 g/dL (ref 30.0–36.0)
MCV: 89.3 fL (ref 78.0–100.0)
Platelets: 264 10*3/uL (ref 150–400)
RBC: 5.06 MIL/uL (ref 4.22–5.81)
RDW: 13.7 % (ref 11.5–15.5)
WBC: 4.2 10*3/uL (ref 4.0–10.5)

## 2015-07-28 LAB — GLUCOSE, CAPILLARY
Glucose-Capillary: 92 mg/dL (ref 65–99)
Glucose-Capillary: 100 mg/dL — ABNORMAL HIGH (ref 65–99)

## 2015-07-28 LAB — AMMONIA: AMMONIA: 39 umol/L — AB (ref 9–35)

## 2015-07-28 LAB — TROPONIN I: Troponin I: <0.03 ng/mL (ref <0.031)

## 2015-07-28 LAB — BRAIN NATRIURETIC PEPTIDE: B Natriuretic Peptide: 12 pg/mL (ref 0.0–100.0)

## 2015-07-28 MED ORDER — POTASSIUM CHLORIDE CRYS ER 20 MEQ PO TBCR
40.0000 meq | EXTENDED_RELEASE_TABLET | Freq: Once | ORAL | Status: AC
  Administered 2015-07-28: 40 meq via ORAL
  Filled 2015-07-28: qty 2

## 2015-07-28 MED ORDER — CLONIDINE HCL 0.1 MG PO TABS
0.1000 mg | ORAL_TABLET | Freq: Three times a day (TID) | ORAL | Status: DC
  Administered 2015-07-28 – 2015-08-01 (×11): 0.1 mg via ORAL
  Filled 2015-07-28 (×11): qty 1

## 2015-07-28 MED ORDER — PANTOPRAZOLE SODIUM 40 MG PO PACK
40.0000 mg | PACK | Freq: Every day | ORAL | Status: DC
  Filled 2015-07-28: qty 20

## 2015-07-28 MED ORDER — SODIUM CHLORIDE 0.9 % IJ SOLN: 10.0000 mL | INTRAMUSCULAR | Status: DC | PRN

## 2015-07-28 MED ORDER — HYDRALAZINE HCL 20 MG/ML IJ SOLN
10.0000 mg | INTRAMUSCULAR | Status: DC | PRN
  Administered 2015-07-28 (×3): 20 mg via INTRAVENOUS
  Filled 2015-07-28 (×4): qty 1

## 2015-07-28 MED ORDER — ACETAMINOPHEN 325 MG PO TABS
650.0000 mg | ORAL_TABLET | ORAL | Status: DC | PRN
  Administered 2015-07-28 – 2015-07-31 (×3): 650 mg via ORAL
  Filled 2015-07-28 (×5): qty 2

## 2015-07-28 MED ORDER — CHLORHEXIDINE GLUCONATE 0.12 % MT SOLN
15.0000 mL | Freq: Two times a day (BID) | OROMUCOSAL | Status: DC
  Administered 2015-07-28 – 2015-08-01 (×8): 15 mL via OROMUCOSAL
  Filled 2015-07-28 (×8): qty 15

## 2015-07-28 MED ORDER — CHLORHEXIDINE GLUCONATE 0.12 % MT SOLN: 15.0000 mL | Freq: Two times a day (BID) | OROMUCOSAL | Status: DC

## 2015-07-28 MED ORDER — PERFLUTREN LIPID MICROSPHERE
INTRAVENOUS | Status: AC
  Filled 2015-07-28: qty 10

## 2015-07-28 MED ORDER — PANTOPRAZOLE SODIUM 40 MG IV SOLR
40.0000 mg | Freq: Every day | INTRAVENOUS | Status: DC
  Administered 2015-07-28 – 2015-07-29 (×2): 40 mg via INTRAVENOUS
  Filled 2015-07-28 (×2): qty 40

## 2015-07-28 MED ORDER — SODIUM CHLORIDE 0.9 % IJ SOLN
10.0000 mL | Freq: Two times a day (BID) | INTRAMUSCULAR | Status: DC
  Administered 2015-07-28 – 2015-08-01 (×9): 10 mL

## 2015-07-28 MED ORDER — CETYLPYRIDINIUM CHLORIDE 0.05 % MT LIQD
7.0000 mL | Freq: Two times a day (BID) | OROMUCOSAL | Status: DC
  Administered 2015-07-28 – 2015-07-31 (×6): 7 mL via OROMUCOSAL

## 2015-07-28 MED ORDER — PERFLUTREN LIPID MICROSPHERE
1.0000 mL | INTRAVENOUS | Status: AC | PRN
  Administered 2015-07-28: 2 mL via INTRAVENOUS
  Filled 2015-07-28: qty 10

## 2015-07-28 NOTE — Progress Notes (Signed)
eLink Physician-Brief Progress Note Patient Name: Darryl Moody DOB: 11-15-1980 MRN: [id number]   Date of Service  07/28/2015  HPI/Events of Note  Camera Check - Patient off BiPAP & comfortable. Hypokalemia (3.3) never replaced from this morning.  eICU Interventions  Heart Healthy Diet. KCl PO. Repeat BMP & Mag in AM 9/17. Instructed nurse & pt to resume BiPAP this evening while sleeping.     Intervention Category Major Interventions: Respiratory failure - evaluation and management  Lawanda Cousins 07/28/2015, 5:10 PM

## 2015-07-28 NOTE — Progress Notes (Signed)
   07/28/15 1015  Vent Select  Invasive or Noninvasive Noninvasive (standby)  Adult Vent Y  Adult Ventilator Settings  Vent Type Servo i  Vent Mode BIPAP  FiO2 (%) 40 %  I Time 0.85 Sec(s)  Pressure Support 13 cmH20  PEEP 5 cmH20  Adult Ventilator Measurements  Peak Airway Pressure 17 L/min  Mean Airway Pressure 8 cmH20  Resp Rate Spontaneous 21 br/min  Resp Rate Total 21 br/min  Exhaled Vt 811 mL  Measured Ve 13.6 mL  HOB> 30 Degrees Y  Adult Ventilator Alarms  Alarms On Y  Ve High Alarm 25 L/min  Ve Low Alarm 5 L/min  Resp Rate High Alarm 35 br/min  Resp Rate Low Alarm 8  PEEP Low Alarm 2 cmH2O  Press High Alarm 30 cmH2O  Extubated patient per MD order.  Placed patient on bipap 18/5 40% rate of 10.  Patient is alert and tolerating well.  RN at bedside.

## 2015-07-28 NOTE — Progress Notes (Addendum)
Name: Darryl Moody MRN: [id number] DOB: 31-Jan-1981    ADMISSION DATE:  07/27/2015 CONSULTATION DATE:  9/15 REFERRING MD :  Gwendolyn Grant   CHIEF COMPLAINT:  Acute encephalopathy   BRIEF PATIENT DESCRIPTION:  34 year old male w/ h/o narcolepsy and untreated OSA, presents to the on 9/15 ER w/ acute encephalopathy which was out of proportion to baseline.  ABG showd acute on  chronic hypercarbic respiratory failure. UDS pos for BDZ & cocaine & he had an elevated ammonia.   SIGNIFICANT EVENTS  PICC LUE 9/16 >>  STUDIES:  UDS 9/15>>> BDZ, cocaine CT head 9/15>>> neg   SUBJECTIVE: sedated on propofol afebrile  VITAL SIGNS: Temp:  [98.1 F (36.7 C)-98.4 F (36.9 C)] 98.1 F (36.7 C) (09/16 0000) Pulse Rate:  [33-106] 78 (09/16 0734) Resp:  [15-27] 20 (09/16 0734) BP: (82-168)/(50-124) 119/76 mmHg (09/16 0734) SpO2:  [86 %-100 %] 100 % (09/16 0734) FiO2 (%):  [40 %-100 %] 40 % (09/16 0734) Weight:  [318 lb 9 oz (144.5 kg)-370 lb (167.831 kg)] 318 lb 9 oz (144.5 kg) (09/16 0206)  PHYSICAL EXAMINATION: General:  Obese AAM acutely ill  Neuro:  Sedated on propofol RASS-2  HEENT:  Neck large/massive, tongue large. MM moist  Cardiovascular:  rrr Lungs:  Clear , decreased BL  Abdomen:  Obese + bowel sounds  Musculoskeletal:  Intact  Skin:  Warm, dry + LE edema    Recent Labs Lab 07/27/15 1154 07/28/15 0309  NA 140 140  K 3.8 3.3*  CL 105 104  CO2 28 27  BUN 6 8  CREATININE 1.03 1.27*  GLUCOSE 120* 112*    Recent Labs Lab 07/27/15 1154 07/27/15 1639 07/28/15 0309  HGB 14.7 15.0 15.1  HCT 43.9 45.6 45.2  WBC 4.1 5.3 4.2  PLT 262 274 264   Dg Abd 1 View  07/27/2015   CLINICAL DATA:  Enteric tube placement.  EXAM: ABDOMEN - 1 VIEW  COMPARISON:  None.  FINDINGS: Enteric tube is present with tip over the stomach in the left upper quadrant just below level of the gastroesophageal junction the side port is above the gastroesophageal junction over the distal esophagus.  This could be advanced approximately 12 cm.  Bowel gas pattern is nonobstructive. Bones soft tissues are within normal.  IMPRESSION: Enteric tube with tip just below the gastroesophageal junction in the left upper quadrant. This could be advanced approximately 12 cm.   Electronically Signed   By: Elberta Fortis M.D.   On: 07/27/2015 16:21   Ct Head Wo Contrast  07/27/2015   CLINICAL DATA:  Altered mental status, sleep apnea  EXAM: CT HEAD WITHOUT CONTRAST  TECHNIQUE: Contiguous axial images were obtained from the base of the skull through the vertex without intravenous contrast.  COMPARISON:  None.  FINDINGS: No skull fracture is noted. Paranasal sinuses and mastoid air cells are unremarkable. No intracranial hemorrhage, mass effect or midline shift. No acute cortical infarction. No hydrocephalus. No mass lesion is noted on this unenhanced scan. The gray and white-matter differentiation is preserved.  IMPRESSION: No acute intracranial abnormality.   Electronically Signed   By: Natasha Mead M.D.   On: 07/27/2015 13:33   Portable Chest Xray  07/28/2015   CLINICAL DATA:  Respiratory failure.  EXAM: PORTABLE CHEST - 1 VIEW  COMPARISON:  None.  FINDINGS: Interim placement of NG tube, its tip is below the left hemidiaphragm. Endotracheal tube in stable position. Cardiomegaly. Right lower lobe atelectasis and/or infiltrate. No pleural effusion  or pneumothorax.  IMPRESSION: 1. Endotracheal tube and NG tube in good anatomic position. 2. Right lower lobe atelectasis and/or infiltrate.   Electronically Signed   By: Maisie Fus  Register   On: 07/28/2015 07:13   Portable Chest Xray  07/27/2015   CLINICAL DATA:  Acute on chronic respiratory failure. ET tube placement.  EXAM: PORTABLE CHEST - 1 VIEW  COMPARISON:  07/27/2015  FINDINGS: Endotracheal tube is 4 cm above the carina. Mild cardiomegaly. No confluent airspace opacities or effusions. No acute bony abnormality.  IMPRESSION: Interval intubation with endotracheal tube 4 cm  above the carina. Mild cardiomegaly.   Electronically Signed   By: Charlett Nose M.D.   On: 07/27/2015 16:13   Dg Chest Port 1 View  07/27/2015   CLINICAL DATA:  Patient with apnea.  Narcolepsy.  EXAM: PORTABLE CHEST - 1 VIEW  COMPARISON:  Chest radiograph 04/03/2015  FINDINGS: Monitoring leads overlie the patient. Low lung volumes. Stable enlarged cardiac and mediastinal contours. No consolidative pulmonary opacities. No pleural effusion or pneumothorax.  IMPRESSION: Low lung volumes.  Cardiomegaly.  No acute cardiopulmonary process.   Electronically Signed   By: Annia Belt M.D.   On: 07/27/2015 14:55    ASSESSMENT / PLAN: NEURO A: Acute Encephalopathy. Has h/o narcolepsy , acute on chronic hypercarbia & mild elevated ammonia.  Substance abuse - cocaine P: Re-assess MS  Propofol gtt + fent int , goal RASS 0  PULMONARY A: Acute on Chronic hypoxic and hypercarbic respiratory failure in the setting of severe untreated sleep apnea and obesity hypoventilation.  Given his exam also concerned about possible secondary PAH, and potentially cor pulmonale. Was on CPAP of 14 cmH2O - had sleep study 3 years ago (while in prison).  P: SBTs  If echo s/o cor pulmonale , can make an argument for trach otherwise trial of extubation   CARDIOLOGY: A: Probable PAH freq PACs P: Get ECHO Admit to tele Avoid BB  HEME A: No acute  P:  heparin Trend CBC  RENAL A: AKI Hypokalemia chronic metabolic alkalosis P: KVO IVFs  Renal dose meds  GI: A: Hyperammonemia  P: Add lactulose Repeat in am RUQ [place]  Endocrine A: mild hyperglycemia P: SSI   ID: A: No acute  P: Trend fever and WBC curve    Family- wife at bedside Summary - Hope for trial of extubation here once weans succesfully  cctime x 32 m  ALVA,RAKESH V. MD [phone]    07/28/2015, 9:18 AM

## 2015-07-28 NOTE — Progress Notes (Signed)
eLink Physician-Brief Progress Note Patient Name: Darryl Moody DOB: 1981-07-09 MRN: [id number]   Date of Service  07/28/2015  HPI/Events of Note  BP remains elevated.  eICU Interventions  Clonidine 0.1mg  po tid.     Intervention Category Intermediate Interventions: Hypertension - evaluation and management  Lawanda Cousins 07/28/2015, 6:16 PM

## 2015-07-28 NOTE — Progress Notes (Signed)
   07/28/15 0959  Adult Ventilator Settings  Vent Type Servo i  Vent Mode PRVC  Vt Set 550 mL  Set Rate 20 bmp  FiO2 (%) 40 %  I Time 0.8 Sec(s)  PEEP 5 cmH20  Placed patient back on previous settings due to extreme agitation.  RN is aware and has paged MD.

## 2015-07-28 NOTE — Care Management Note (Signed)
Case Management Note  Patient Details  Name: [name] MRN: [id number] Date of Birth: 1981-03-14  Subjective/Objective:     Adm w resp distress, encepalopathy    ,vent           Action/Plan: lives w fam   Expected Discharge Date:                  Expected Discharge Plan:     In-House Referral:     Discharge planning Services     Post Acute Care Choice:    Choice offered to:     DME Arranged:    DME Agency:     HH Arranged:    HH Agency:     Status of Service:     Medicare Important Message Given:    Date Medicare IM Given:    Medicare IM give by:    Date Additional Medicare IM Given:    Additional Medicare Important Message give by:     If discussed at Long Length of Stay Meetings, dates discussed:    Additional Comments: ur review done  Hanley Hays, RN 07/28/2015, 7:53 AM

## 2015-07-28 NOTE — Progress Notes (Signed)
Peripherally Inserted Central Catheter/Midline Placement  The IV Nurse has discussed with the patient and/or persons authorized to consent for the patient, the purpose of this procedure and the potential benefits and risks involved with this procedure.  The benefits include less needle sticks, lab draws from the catheter and patient may be discharged home with the catheter.  Risks include, but not limited to, infection, bleeding, blood clot (thrombus formation), and puncture of an artery; nerve damage and irregular heat beat.  Alternatives to this procedure were also discussed.  Consent signed by wife, Delmore Sear, due to altered mental status.  PICC/Midline Placement Documentation        Cothren, Lajean Manes 07/28/2015, 9:23 AM

## 2015-07-29 ENCOUNTER — Inpatient Hospital Stay (HOSPITAL_COMMUNITY): Payer: Medicaid Other

## 2015-07-29 ENCOUNTER — Encounter (HOSPITAL_COMMUNITY): Payer: Self-pay | Admitting: *Deleted

## 2015-07-29 DIAGNOSIS — J9622 Acute and chronic respiratory failure with hypercapnia: Principal | ICD-10-CM

## 2015-07-29 LAB — BASIC METABOLIC PANEL
Anion gap: 7 (ref 5–15)
BUN: 9 mg/dL (ref 6–20)
CO2: 29 mmol/L (ref 22–32)
Calcium: 8.6 mg/dL — ABNORMAL LOW (ref 8.9–10.3)
Chloride: 103 mmol/L (ref 101–111)
Creatinine, Ser: 1.13 mg/dL (ref 0.61–1.24)
GFR calc Af Amer: >60 mL/min (ref >60)
GLUCOSE: 137 mg/dL — AB (ref 65–99)
POTASSIUM: 3.5 mmol/L (ref 3.5–5.1)
Sodium: 139 mmol/L (ref 135–145)

## 2015-07-29 LAB — MAGNESIUM: Magnesium: 2.1 mg/dL (ref 1.7–2.4)

## 2015-07-29 LAB — CBC
HCT: 42.6 % (ref 39.0–52.0)
Hemoglobin: 13.7 g/dL (ref 13.0–17.0)
MCH: 29.3 pg (ref 26.0–34.0)
MCHC: 32.2 g/dL (ref 30.0–36.0)
MCV: 91 fL (ref 78.0–100.0)
PLATELETS: 270 10*3/uL (ref 150–400)
RBC: 4.68 MIL/uL (ref 4.22–5.81)
RDW: 14.1 % (ref 11.5–15.5)
WBC: 7.1 10*3/uL (ref 4.0–10.5)

## 2015-07-29 NOTE — Progress Notes (Signed)
Name: Darryl Moody MRN: [id number] DOB: 06/24/1981    ADMISSION DATE:  07/27/2015 CONSULTATION DATE:  9/15 REFERRING MD :  Gwendolyn Grant   CHIEF COMPLAINT:  Acute encephalopathy   BRIEF PATIENT DESCRIPTION:  34 year old male w/ h/o narcolepsy and untreated OSA, presents to the on 9/15 ER w/ acute encephalopathy which was out of proportion to baseline.  ABG showd acute on  chronic hypercarbic respiratory failure. UDS pos for BDZ & cocaine & he had an elevated ammonia.   SIGNIFICANT EVENTS  PICC LUE 9/16 >>  STUDIES:  UDS 9/15>>> BDZ, cocaine CT head 9/15>>> neg   SUBJECTIVE: sedated on propofol afebrile  VITAL SIGNS: Temp:  [98.4 F (36.9 C)-99.2 F (37.3 C)] 98.4 F (36.9 C) (09/17 0800) Pulse Rate:  [70-129] 104 (09/17 1000) Resp:  [15-32] 27 (09/17 1000) BP: (108-194)/(25-124) 125/67 mmHg (09/17 1000) SpO2:  [90 %-100 %] 90 % (09/17 1000) FiO2 (%):  [30 %-40 %] 30 % (09/17 0342) Weight:  [142.8 kg (314 lb 13.1 oz)] 142.8 kg (314 lb 13.1 oz) (09/17 0300)  PHYSICAL EXAMINATION: General:  Obese AAM acutely ill  Neuro:  Sedated on propofol RASS-2  HEENT:  Neck large/massive, tongue large. MM moist  Cardiovascular:  rrr Lungs:  Clear , decreased BL  Abdomen:  Obese + bowel sounds  Musculoskeletal:  Intact  Skin:  Warm, dry + LE edema    Recent Labs Lab 07/27/15 1154 07/28/15 0309 07/29/15 0326  NA 140 140 139  K 3.8 3.3* 3.5  CL 105 104 103  CO2 28 27 29   BUN 6 8 9   CREATININE 1.03 1.27* 1.13  GLUCOSE 120* 112* 137*    Recent Labs Lab 07/27/15 1639 07/28/15 0309 07/29/15 0326  HGB 15.0 15.1 13.7  HCT 45.6 45.2 42.6  WBC 5.3 4.2 7.1  PLT 274 264 270   Dg Abd 1 View  07/27/2015   CLINICAL DATA:  Enteric tube placement.  EXAM: ABDOMEN - 1 VIEW  COMPARISON:  None.  FINDINGS: Enteric tube is present with tip over the stomach in the left upper quadrant just below level of the gastroesophageal junction the side port is above the gastroesophageal junction  over the distal esophagus. This could be advanced approximately 12 cm.  Bowel gas pattern is nonobstructive. Bones soft tissues are within normal.  IMPRESSION: Enteric tube with tip just below the gastroesophageal junction in the left upper quadrant. This could be advanced approximately 12 cm.   Electronically Signed   By: Elberta Fortis M.D.   On: 07/27/2015 16:21   Ct Head Wo Contrast  07/27/2015   CLINICAL DATA:  Altered mental status, sleep apnea  EXAM: CT HEAD WITHOUT CONTRAST  TECHNIQUE: Contiguous axial images were obtained from the base of the skull through the vertex without intravenous contrast.  COMPARISON:  None.  FINDINGS: No skull fracture is noted. Paranasal sinuses and mastoid air cells are unremarkable. No intracranial hemorrhage, mass effect or midline shift. No acute cortical infarction. No hydrocephalus. No mass lesion is noted on this unenhanced scan. The gray and white-matter differentiation is preserved.  IMPRESSION: No acute intracranial abnormality.   Electronically Signed   By: Natasha Mead M.D.   On: 07/27/2015 13:33   Portable Chest Xray  07/28/2015   CLINICAL DATA:  Respiratory failure.  EXAM: PORTABLE CHEST - 1 VIEW  COMPARISON:  None.  FINDINGS: Interim placement of NG tube, its tip is below the left hemidiaphragm. Endotracheal tube in stable position. Cardiomegaly. Right lower lobe  atelectasis and/or infiltrate. No pleural effusion or pneumothorax.  IMPRESSION: 1. Endotracheal tube and NG tube in good anatomic position. 2. Right lower lobe atelectasis and/or infiltrate.   Electronically Signed   By: Maisie Fus  Register   On: 07/28/2015 07:13   Portable Chest Xray  07/27/2015   CLINICAL DATA:  Acute on chronic respiratory failure. ET tube placement.  EXAM: PORTABLE CHEST - 1 VIEW  COMPARISON:  07/27/2015  FINDINGS: Endotracheal tube is 4 cm above the carina. Mild cardiomegaly. No confluent airspace opacities or effusions. No acute bony abnormality.  IMPRESSION: Interval intubation  with endotracheal tube 4 cm above the carina. Mild cardiomegaly.   Electronically Signed   By: Charlett Nose M.D.   On: 07/27/2015 16:13   Dg Chest Port 1 View  07/27/2015   CLINICAL DATA:  Patient with apnea.  Narcolepsy.  EXAM: PORTABLE CHEST - 1 VIEW  COMPARISON:  Chest radiograph 04/03/2015  FINDINGS: Monitoring leads overlie the patient. Low lung volumes. Stable enlarged cardiac and mediastinal contours. No consolidative pulmonary opacities. No pleural effusion or pneumothorax.  IMPRESSION: Low lung volumes.  Cardiomegaly.  No acute cardiopulmonary process.   Electronically Signed   By: Annia Belt M.D.   On: 07/27/2015 14:55    ASSESSMENT / PLAN: NEURO A: Acute Encephalopathy, improved. Has h/o narcolepsy , acute on chronic hypercarbia & mild elevated ammonia.  Substance abuse - cocaine positive this admit P: D/c all sedating meds Lactulose ordered, will continue for one more day and then d/c  PULMONARY A: Acute on Chronic hypoxic and hypercarbic respiratory failure in the setting of severe untreated sleep apnea and obesity hypoventilation.  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  P: Tolerated extubation Mandatory NIPPV qhs and when sleeping. Given his CPAP needs in past, want his iPAP to be higher than current 5. Needs to approach 14. Will start with 12 >> settings 15/12  CARDIOLOGY: A: Probable PAH, TTE 9/16 was a poor study to eval freq PACs HTN P: Avoid BB Clonidine added 9/16 pm Need to continue PAH eval as he stabilizes, TTE poor study  HEME A: No acute  P: [place] heparin Trend CBC  RENAL A: AKI, improving Hypokalemia chronic metabolic alkalosis P: KVO IVFs Renal dose meds Follow BMP  GI: A: Hyperammonemia, 51 > 39 P: One more day lactulose and then d/c RUQ [place] Hepatitis panel   Endocrine A: mild hyperglycemia P: SSI   ID: A: No acute  P: Trend fever and WBC curve   Family- Mom  at bedside  Independent CC time 32 minutes  OK to mobilize, move to a SDU bed/   Levy Pupa, MD, PhD 07/29/2015, 10:38 AM [facility] [phone] or if no answer [phone]

## 2015-07-29 NOTE — Progress Notes (Signed)
07/29/2015 10:17 PM [place] of L upper arm PICC occluded. IV team consult placed. [place], Linnell Fulling

## 2015-07-30 LAB — BASIC METABOLIC PANEL
Anion gap: 8 (ref 5–15)
BUN: 7 mg/dL (ref 6–20)
CO2: 27 mmol/L (ref 22–32)
CREATININE: 0.92 mg/dL (ref 0.61–1.24)
Calcium: 8.9 mg/dL (ref 8.9–10.3)
Chloride: 106 mmol/L (ref 101–111)
GFR calc Af Amer: >60 mL/min (ref >60)
GLUCOSE: 128 mg/dL — AB (ref 65–99)
POTASSIUM: 4 mmol/L (ref 3.5–5.1)
SODIUM: 141 mmol/L (ref 135–145)

## 2015-07-30 LAB — AMMONIA: AMMONIA: 93 umol/L — AB (ref 9–35)

## 2015-07-30 LAB — HEPATITIS PANEL, ACUTE
HEP A IGM: NEGATIVE
HEP B C IGM: NEGATIVE
HEP B S AG: NEGATIVE

## 2015-07-30 MED ORDER — PANTOPRAZOLE SODIUM 40 MG PO TBEC
40.0000 mg | DELAYED_RELEASE_TABLET | Freq: Every day | ORAL | Status: DC
  Administered 2015-07-30 – 2015-07-31 (×2): 40 mg via ORAL
  Filled 2015-07-30 (×2): qty 1

## 2015-07-30 MED ORDER — ALBUTEROL SULFATE (2.5 MG/3ML) 0.083% IN NEBU: 2.5000 mg | INHALATION_SOLUTION | RESPIRATORY_TRACT | Status: DC | PRN

## 2015-07-30 NOTE — Evaluation (Addendum)
Physical Therapy Evaluation and D/C Patient Details Name: Darryl Moody MRN: [id number] DOB: 1981/01/17 Today's Date: 07/30/2015   History of Present Illness  34 year old male w/ h/o narcolepsy and untreated OSA, presents to the on 9/15 ER w/ acute encephalopathy which was out of proportion to baseline.   Clinical Impression  Pt admitted with above diagnosis. Pt currently without significant functional limitations able to ambulate without assist and without LOB with challenges.  Pt does not need skilled PT at this time.  Sign off.  Follow Up Recommendations No PT follow up    Equipment Recommendations  None recommended by PT    Recommendations for Other Services       Precautions / Restrictions Precautions Precautions: None Restrictions Weight Bearing Restrictions: No      Mobility  Bed Mobility Overal bed mobility: Independent                Transfers Overall transfer level: Independent                  Ambulation/Gait Ambulation/Gait assistance: Independent Ambulation Distance (Feet): 336 Feet Assistive device: None Gait Pattern/deviations: WFL(Within Functional Limits)   Gait velocity interpretation: at or above normal speed for age/gender General Gait Details: Pt reports that ambulation is close to baseline.  No deficits noted with challenges to balance.    Stairs            Wheelchair Mobility    Modified Rankin (Stroke Patients Only)       Balance Overall balance assessment: Independent                               Standardized Balance Assessment Standardized Balance Assessment : Dynamic Gait Index   Dynamic Gait Index Level Surface: Normal Change in Gait Speed: Normal Gait with Horizontal Head Turns: Normal Gait with Vertical Head Turns: Normal Gait and Pivot Turn: Normal Step Over Obstacle: Normal Step Around Obstacles: Normal Steps: Mild Impairment Total Score: 23       Pertinent Vitals/Pain Pain  Assessment: No/denies pain  VSS    Home Living Family/patient expects to be discharged to:: Private residence Living Arrangements: Spouse/significant other Available Help at Discharge: Family;Available 24 hours/day Type of Home: House Home Access: Stairs to enter Entrance Stairs-Rails: None Entrance Stairs-Number of Steps: 3 Home Layout: One level Home Equipment: None      Prior Function Level of Independence: Independent               Hand Dominance        Extremity/Trunk Assessment   Upper Extremity Assessment: Defer to OT evaluation           Lower Extremity Assessment: Overall WFL for tasks assessed      Cervical / Trunk Assessment: Normal  Communication   Communication: No difficulties  Cognition Arousal/Alertness: Awake/alert Behavior During Therapy: WFL for tasks assessed/performed Overall Cognitive Status: Within Functional Limits for tasks assessed                      General Comments General comments (skin integrity, edema, etc.): Scored 23/24 on DGI suggesting not at risk of falls.     Exercises        Assessment/Plan    PT Assessment Patent does not need any further PT services  PT Diagnosis     PT Problem List    PT Treatment Interventions     PT Goals (  Current goals can be found in the Care Plan section) Acute Rehab PT Goals PT Goal Formulation: All assessment and education complete, DC therapy    Frequency     Barriers to discharge        Co-evaluation               End of Session Equipment Utilized During Treatment: Gait belt Activity Tolerance: Patient tolerated treatment well Patient left: in bed;with call bell/phone within reach Nurse Communication: Mobility status         Time: 1478-2956 PT Time Calculation (min) (ACUTE ONLY): 11 min   Charges:   PT Evaluation $Initial PT Evaluation Tier I: 1 Procedure     PT G CodesBerline Lopes Aug 20, 2015, 3:32 PM  [facility] Acute  Rehabilitation [phone] (pager)

## 2015-07-30 NOTE — Progress Notes (Signed)
Name: Darryl Moody MRN: [id number] DOB: 1981/01/23    ADMISSION DATE:  07/27/2015 CONSULTATION DATE:  9/15 REFERRING MD :  Gwendolyn Grant   CHIEF COMPLAINT:  Acute encephalopathy   BRIEF PATIENT DESCRIPTION:  34 year old male w/ h/o narcolepsy and untreated OSA, presents to the on 9/15 ER w/ acute encephalopathy which was out of proportion to baseline.  ABG showd acute on  chronic hypercarbic respiratory failure. UDS pos for BDZ & cocaine & he had an elevated ammonia.   SIGNIFICANT EVENTS  PICC LUE 9/16 >>  STUDIES:  UDS 9/15>>> BDZ, cocaine CT head 9/15>>> neg   SUBJECTIVE: sedated on propofol afebrile  VITAL SIGNS: Temp:  [97.8 F (36.6 C)-98.6 F (37 C)] 98 F (36.7 C) (09/18 0846) Pulse Rate:  [67-106] 98 (09/18 1021) Resp:  [13-29] 20 (09/18 1021) BP: (108-159)/(77-103) 159/93 mmHg (09/18 0846) SpO2:  [91 %-100 %] 100 % (09/18 0846) FiO2 (%):  [30 %] 30 % (09/18 1021) Weight:  [144.198 kg (317 lb 14.4 oz)] 144.198 kg (317 lb 14.4 oz) (09/18 0340)  PHYSICAL EXAMINATION: General:  Obese AAM acutely ill  Neuro:  Sedated on propofol RASS-2  HEENT:  Neck large/massive, tongue large. MM moist  Cardiovascular:  rrr Lungs:  Clear , decreased BL  Abdomen:  Obese + bowel sounds  Musculoskeletal:  Intact  Skin:  Warm, dry + LE edema    Recent Labs Lab 07/28/15 0309 07/29/15 0326 07/30/15 0259  NA 140 139 141  K 3.3* 3.5 4.0  CL 104 103 106  CO2 BUN CREATININE 1.27* 1.13 0.92  GLUCOSE 112* 137* 128*    Recent Labs Lab 07/27/15 1639 07/28/15 0309 07/29/15 0326  HGB 15.0 15.1 13.7  HCT 45.6 45.2 42.6  WBC 5.3 4.2 7.1  PLT 274 264 270   US Abdomen Limited Ruq  07/29/2015   CLINICAL DATA:  Abnormal LFTs  EXAM: US ABDOMEN LIMITED - RIGHT UPPER QUADRANT  COMPARISON:  None.  FINDINGS: Gallbladder:  No gallstones or wall thickening visualized. No sonographic Murphy sign noted.  Common bile duct:  Diameter: 4 mm in diameter within normal limits   Liver:  No focal lesion identified. Within normal limits in parenchymal echogenicity.  IMPRESSION: Normal right upper quadrant ultrasound   Electronically Signed   By: Natasha Mead M.D.   On: 07/29/2015 17:45    ASSESSMENT / PLAN: NEURO A: Acute Encephalopathy, improved. Has h/o narcolepsy , acute on chronic hypercarbia & mild elevated ammonia.  Substance abuse - cocaine positive this admit P: D/c all sedating meds Lactulose ordered, will continue for one more day and then d/c  PULMONARY A: Acute on Chronic hypoxic and hypercarbic respiratory failure in the setting of severe untreated sleep apnea and obesity hypoventilation.  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  P: Tolerated extubation Mandatory NIPPV qhs and when sleeping. Given his CPAP needs in past, want his iPAP to be higher than current 5. Needs to approach 14. Start with 12 >> settings 15/12 I believe based on the severity of his OSA that he may end up requiring trach  CARDIOLOGY: A: Probable PAH, TTE 9/16 was a poor study to eval freq PACs HTN P: Avoid BB Clonidine added 9/16 pm Need to continue PAH eval as he stabilizes, TTE poor study  HEME A: No acute  P: [place] heparin Trend CBC  RENAL A: AKI, improving Hypokalemia chronic metabolic alkalosis P: KVO IVFs Renal dose meds Follow BMP  GI: A: Hyperammonemia, 51 > 39 > 93 RUQ [place] > normal appearance liver; hepatitis panel negative P: Etiology unclear, doubt a urea cycle defect, more likely due to hepatic dysfunction but RUQ [place] normal Consider hepatic congestion from [facility]  Endocrine A: mild hyperglycemia P: SSI   ID: A: No acute  P: Trend fever and WBC curve   Family- Mom at bedside   OK to mobilize, move to a SDU bed  Levy Pupa, MD, PhD 07/30/2015, 10:50 AM [facility] [phone] or if no answer [phone]

## 2015-07-31 DIAGNOSIS — I10 Essential (primary) hypertension: Secondary | ICD-10-CM

## 2015-07-31 MED ORDER — AMLODIPINE BESYLATE 5 MG PO TABS
5.0000 mg | ORAL_TABLET | Freq: Every day | ORAL | Status: DC
  Administered 2015-07-31 – 2015-08-01 (×2): 5 mg via ORAL
  Filled 2015-07-31 (×2): qty 1

## 2015-07-31 NOTE — Care Management Note (Addendum)
Case Management Note  Patient Details  Name: Darryl Moody MRN: [id number] Date of Birth: 07-28-1981  Subjective/Objective:     Pt adm w encephalopathy, sleep apnea, narcolepsy               Action/Plan: lives w fam, no ins   Expected Discharge Date:                  Expected Discharge Plan:  Home/Self Care  In-House Referral:     Discharge planning Services  [facility], CM Consult  Post Acute Care Choice:  Durable Medical Equipment Choice offered to:  Patient  DME Arranged:  Bipap DME Agency:  [facility]  HH Arranged:    [facility] Agency:     Status of Service:     Medicare Important Message Given:    Date Medicare IM Given:    Medicare IM give by:    Date Additional Medicare IM Given:    Additional Medicare Important Message give by:     If discussed at Long Length of Stay Meetings, dates discussed:    Additional Comments: have faxed over for sleep study but pt states had sleep study in [place]. [facility] w [facility] [phone] and faxed release to [phone] to get sleep study. Have spoke w [facility] dme about bipap. For dc 9-20 per dr Molli Knock. If we don't have sleep study in hand pt will need to pay 100.00 up front and they say they can pay this if needed.  Hanley Hays, RN 07/31/2015, 11:17 AM

## 2015-07-31 NOTE — Care Management Note (Signed)
Case Management Note  Patient Details  Name: JOSYAH ACHOR MRN: [id number] Date of Birth: 09/23/1981  Subjective/Objective:         Adm w encepalopathy           Action/Plan: lives w fam, no ins listed   Expected Discharge Date:                  Expected Discharge Plan:  Home/Self Care  In-House Referral:     Discharge planning Services  [facility]  Post Acute Care Choice:    Choice offered to:     DME Arranged:    DME Agency:     HH Arranged:    HH Agency:     Status of Service:     Medicare Important Message Given:    Date Medicare IM Given:    Medicare IM give by:    Date Additional Medicare IM Given:    Additional Medicare Important Message give by:     If discussed at Long Length of Stay Meetings, dates discussed:    Additional Comments:left pt inform on [facility] and inform on [facility]  Waldon Reining 07/31/2015, 10:23 AM

## 2015-07-31 NOTE — Progress Notes (Signed)
Patient was not on bi-pap and RN stated that he didn't wear it most of the night. RT will continue to monitor.

## 2015-07-31 NOTE — Progress Notes (Addendum)
Name: Darryl Moody MRN: [id number] DOB: [birth year]    ADMISSION DATE:  07/27/2015 CONSULTATION DATE:  9/15 REFERRING MD :  Gwendolyn Grant   CHIEF COMPLAINT:  Acute encephalopathy   BRIEF PATIENT DESCRIPTION:  34 year old male w/ h/o narcolepsy and untreated OSA, presents to the on 9/15 ER w/ acute encephalopathy which was out of proportion to baseline.  ABG showd acute on  chronic hypercarbic respiratory failure. UDS pos for BDZ & cocaine & he had an elevated ammonia.   SIGNIFICANT EVENTS  PICC LUE 9/16 >>  STUDIES:  UDS 9/15>>> BDZ, cocaine CT head 9/15>>> neg 9/18 ammonia level 93  SUBJECTIVE: Alert and interactive, walking the halls, moving all ext to command.  VITAL SIGNS: Temp:  [97.7 F (36.5 C)-98.4 F (36.9 C)] 97.8 F (36.6 C) (09/19 0747) Pulse Rate:  [65-102] 102 (09/19 0700) Resp:  [13-28] 20 (09/19 0700) BP: (133-189)/(71-142) 189/94 mmHg (09/19 0700) SpO2:  [94 %-100 %] 99 % (09/19 0700) Weight:  [144.516 kg (318 lb 9.6 oz)] 144.516 kg (318 lb 9.6 oz) (09/19 0303)  PHYSICAL EXAMINATION: General:  Obese AAM alert and interactive  Neuro:  Moving all ext to command, sensation intact. Head: [place]/AT EENT:  Neck large/massive, tongue large. MM moist, PERRL, EOM-I. Cardiovascular:  RRR, Nl S1/S2, -M/R/G. Lungs:  Clear, decreased BL  Abdomen:  Obese + bowel sounds  Musculoskeletal:  Intact  Skin:  Warm, dry + LE edema    Recent Labs Lab 07/28/15 0309 07/29/15 0326 07/30/15 0259  NA 140 139 141  K 3.3* 3.5 4.0  CL 104 103 106  CO2 BUN CREATININE 1.27* 1.13 0.92  GLUCOSE 112* 137* 128*    Recent Labs Lab 07/27/15 1639 07/28/15 0309 07/29/15 0326  HGB 15.0 15.1 13.7  HCT 45.6 45.2 42.6  WBC 5.3 4.2 7.1  PLT 274 264 270   US Abdomen Limited Ruq  07/29/2015   CLINICAL DATA:  Abnormal LFTs  EXAM: US ABDOMEN LIMITED - RIGHT UPPER QUADRANT  COMPARISON:  None.  FINDINGS: Gallbladder:  No gallstones or wall thickening visualized. No  sonographic Murphy sign noted.  Common bile duct:  Diameter: 4 mm in diameter within normal limits  Liver:  No focal lesion identified. Within normal limits in parenchymal echogenicity.  IMPRESSION: Normal right upper quadrant ultrasound   Electronically Signed   By: Natasha Mead M.D.   On: 07/29/2015 17:45   I reviewed CXR myself, cardiomegaly.  ASSESSMENT / PLAN: NEURO A: Acute Encephalopathy, improved. Has h/o narcolepsy , acute on chronic hypercarbia & mild elevated ammonia.  Substance abuse - cocaine positive this admit P: D/c all sedating meds Advised patient no no further use of benzos or narcotics given severe sleep apnea. Lactulose ordered, will D/C in AM.  PULMONARY A: Acute on Chronic hypoxic and hypercarbic respiratory failure in the setting of severe untreated sleep apnea and obesity hypoventilation.  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  P: Tolerated extubation Mandatory BiPAP 15/12 while asleep. Given his BiPAP needs in past, want his iPAP to be higher than current 5. Needs to approach 14. Will start with 12 >> settings 15/12 Discussed with case management, patient has no payer source, case management to arrange for home CPAP, ideally would like to discharge with a  CPAP at hand then get a sleep study as outpatient given severity of his case.  CARDIOLOGY: A: Probable PAH, TTE 9/16 was a poor study to eval Freq PACs HTN SBP of 180 this AM. P: Avoid BB Clonidine added 9/16 pm Need to continue PAH eval outpatient but little doubt he has it. Given suspicion of PAH and now systemic HTN will start low dose norvasc.  HEME A: No acute  P: [place] heparin Trend CBC  RENAL A: AKI, improving Hypokalemia chronic metabolic alkalosis P: KVO IVFs. Replace electrolytes as indicated. Follow BMP.  GI: A: Hyperammonemia, 51 > 39 P: D/C lactulose in AM. RUQ [place] Hepatitis panel  negative  Endocrine A: mild hyperglycemia P: SSI   ID: A: No acute  P: Trend fever and WBC curve   Discussed with bedside RN and case management.  Family- Mom and patient updated at bedside.  Hold in SDU overnight and once able to get CPAP will discharge with F/U in sleep clinic.  Patient also given information for outpatient PCP.  Alyson Reedy, M.D. [facility] Pulmonary/Critical Care Medicine. Pager: [phone]. After hours pager: [phone].

## 2015-08-01 ENCOUNTER — Telehealth: Payer: Self-pay | Admitting: Pulmonary Disease

## 2015-08-01 LAB — CBC
HEMATOCRIT: 40.7 % (ref 39.0–52.0)
HEMOGLOBIN: 13.3 g/dL (ref 13.0–17.0)
MCH: 29.4 pg (ref 26.0–34.0)
MCHC: 32.7 g/dL (ref 30.0–36.0)
MCV: 89.8 fL (ref 78.0–100.0)
PLATELETS: 234 10*3/uL (ref 150–400)
RBC: 4.53 MIL/uL (ref 4.22–5.81)
RDW: 13.6 % (ref 11.5–15.5)
WBC: 4.8 10*3/uL (ref 4.0–10.5)

## 2015-08-01 LAB — PHOSPHORUS: Phosphorus: 4.6 mg/dL (ref 2.5–4.6)

## 2015-08-01 LAB — BASIC METABOLIC PANEL
ANION GAP: 6 (ref 5–15)
BUN: 9 mg/dL (ref 6–20)
CALCIUM: 8.9 mg/dL (ref 8.9–10.3)
CO2: 30 mmol/L (ref 22–32)
Chloride: 102 mmol/L (ref 101–111)
Creatinine, Ser: 1.04 mg/dL (ref 0.61–1.24)
GFR calc Af Amer: >60 mL/min (ref >60)
GLUCOSE: 110 mg/dL — AB (ref 65–99)
Potassium: 3.7 mmol/L (ref 3.5–5.1)
SODIUM: 138 mmol/L (ref 135–145)

## 2015-08-01 LAB — MAGNESIUM: MAGNESIUM: 1.9 mg/dL (ref 1.7–2.4)

## 2015-08-01 MED ORDER — PANTOPRAZOLE SODIUM 40 MG PO TBEC: 40.0000 mg | DELAYED_RELEASE_TABLET | Freq: Every day | ORAL | Status: DC

## 2015-08-01 MED ORDER — CLONIDINE HCL 0.1 MG PO TABS: 0.1000 mg | ORAL_TABLET | Freq: Three times a day (TID) | ORAL | Status: DC

## 2015-08-01 MED ORDER — AMLODIPINE BESYLATE 5 MG PO TABS: 5.0000 mg | ORAL_TABLET | Freq: Every day | ORAL | Status: DC

## 2015-08-01 NOTE — Progress Notes (Signed)
Discharge instructions explained to pt, no questions left unanswered. Pt dressed, PICC line already removed earlier today. Pt walked to car with wife driving.

## 2015-08-01 NOTE — Telephone Encounter (Signed)
Called and spoke to [facility]. Darryl Moody is unavailable. Pt has already been discharged. Called and spoke to pt. Appt made with TP for 9.29.2016. Pt verbalized understanding and denied any further questions or concerns at this time.

## 2015-08-01 NOTE — Telephone Encounter (Signed)
Please advise Dr. Alva thanks 

## 2015-08-01 NOTE — Discharge Summary (Signed)
Physician Discharge Summary       Patient ID: Darryl Moody MRN: [id number] DOB/AGE: May 04, 1981 34 y.o.  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 Presents to the ER on 9/15 w/ 2 d h/o excess fatigue and altered mental status. Wife reports has narcolepsy at baseline. Often will fall asleep at any point during the day. States that over the last two days prior to admit will go to sleep and can not be woken up. EMS called. On arrival his O2 sats were in 90s on room air but would dip down in to the 70s w/ his 20 second periods of apnea. These were also associated w/ freq PVCs. An arterial blood gas was obtained suggesting a chronic hypercarbic respiratory failure. The only other initial pert positive was an elevated ammonia. PCCM asked to admit.   He failed NIPPV in the ER. Ultimately was intubated and admitted to the intensive care. He was supported on full vent support overnight. His mental status improved. He was successfully extubated on 9/16. He was supported on NIPPV post extubation. An echo was obtained, this was a poor quality study due to body habitus. Also had hyperammonemia which we worked up with RUQ [place] and hepatic panel. These were negative. We did treat him with lactulose. His ammonia level went from 51 down to 39. His BIPAP was titrated and currently he is on ipap of 15cmH2O and epap of 12 cmH20 and 2 liters bleed in. At time of discharge he is ambulating in  hall with improved energy levels and strength. He is ready for discharge as of 9/20.    Discharge Plan by active problems   Acute on Chronic hypoxic and hypercarbic respiratory failure in the setting of severe untreated sleep apnea and obesity hypoventilation.  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  Plan: Mandatory BiPAP 15/12 while asleep; bleed in 2 liters  Wt loss F/u our office   Probable PAH, TTE 9/16 was a poor study to eval HTN Plan: Avoid BB Clonidine added 9/16 pm Need to continue PAH eval outpatient but little doubt he has it. Cont low dose norvasc.   Significant Hospital tests/ studies  Consults  STUDIES:  UDS 9/15>>> BDZ, cocaine CT head 9/15>>> neg 9/18 ammonia level 93 ECHO 9/20: EF 60%, unable to assess RV size or fxn  Discharge Exam: BP 97/59 mmHg  Pulse 83  Temp(Src) 98.4 F (36.9 C) (Oral)  Resp 18  Ht  (1.803 m)  Wt 144.5 kg (318 lb 9 oz)  BMI 44.45 kg/m2  SpO2 95%  General: Obese AAM alert and interactive  Neuro: Moving all ext to command, sensation intact. Head: [place]/AT EENT: Neck large/massive, tongue large. MM moist, PERRL, EOM-I. Cardiovascular: RRR, Nl S1/S2, -M/R/G. Lungs: Clear, decreased BL  Abdomen: Obese + bowel sounds  Musculoskeletal: Intact  Skin: Warm, dry + LE edema    Labs at discharge Lab Results  Component Value Date   CREATININE 1.04 08/01/2015  BUN 9 08/01/2015   NA 138 08/01/2015   K 3.7 08/01/2015   CL 102 08/01/2015   CO2 30 08/01/2015   Lab Results  Component Value Date   WBC 4.8 08/01/2015   HGB 13.3 08/01/2015   HCT 40.7 08/01/2015   MCV 89.8 08/01/2015   PLT 234 08/01/2015   Lab Results  Component Value Date   ALT 17 07/28/2015   AST 27 07/28/2015   ALKPHOS 45 07/28/2015   BILITOT 0.9 07/28/2015   No results found for: INR, PROTIME  Current radiology studies No results found.  Disposition:  01-Home or Self  Care     Medication List    TAKE these medications        amLODipine 5 MG tablet  Commonly known as:  NORVASC  Take 1 tablet (5 mg total) by mouth daily.     cloNIDine 0.1 MG tablet  Commonly known as:  CATAPRES  Take 1 tablet (0.1 mg total) by mouth 3 (three) times daily.     pantoprazole 40 MG tablet  Commonly known as:  PROTONIX  Take 1 tablet (40 mg total) by mouth at bedtime.       Follow-up Information    Follow up with [place] [facility] Today.   Why:  appt for november 9,2016 be at sleep lab at 8pm   Contact information:   [address] [phone]      Discharged Condition: good  Physician Statement:   The Patient was personally examined, the discharge assessment and plan has been personally reviewed and I agree with ACNP Babcock's assessment and plan. > 30 minutes of time have been dedicated to discharge assessment, planning and discharge instructions.   Signed: BABCOCK,PETE 08/01/2015, 3:54 PM

## 2015-08-01 NOTE — Progress Notes (Signed)
Room Air oxygen saturation 88% at 0701 am at rest.

## 2015-08-01 NOTE — Progress Notes (Signed)
[facility] has everything worked out for pt to get home bipap. [facility] rep saw pt and they will del to home today a bipap.

## 2015-08-01 NOTE — Telephone Encounter (Signed)
hosp FU - can be seen by TP for first Ov, I can see him after

## 2015-08-01 NOTE — Progress Notes (Signed)
PICC line removed by IV RN at 1100

## 2015-08-02 NOTE — Progress Notes (Signed)
Late entry for 08/01/15 - Clinical Social Work/Care Management follow up:  Contacted by Stark Klein, [facility] representative [phone]) at approximately 1545 hours on 08/01/15.  Their Respiratory Therapist was on the way to patient's home to deliver bi-pap.  However, since patient had not had a qualifying sleep study and patient is uninsured, patient did not qualify for charity care via [facility].  When informed of this, patient reportedly refused to pay for bi-pap or sleep study, stating "I might as well [facility] back to [facility] - at least I got what I needed there."  Worked with Ms. Roxan Hockey, [facility], and her branch manager to formulate a plan for this patient.  [place] Clinical Social Work Department's discretionary funds/letter of guarantee process will cover cost of bi-pap for patient for a maximum of 2 months until he can complete sleep study, which per Ms. Roxan Hockey is scheduled for either "October 12 or November 12."  If sleep study demonstrates need for bi-pap, [facility] will continued to provide bi-pap under charity care process.  If sleep study does not demonstrate need for bi-pap, or if patient is not compliant with appointment for sleep study, bi-pap will be discontinued.  [facility] respiratory therapist to inform patient of this plan at the time of yesterday afternoon's visit.  Rae Halsted, ACSW, LCSW Director  Clinical Social Work [phone]

## 2015-08-06 ENCOUNTER — Ambulatory Visit (HOSPITAL_BASED_OUTPATIENT_CLINIC_OR_DEPARTMENT_OTHER): Payer: Self-pay | Attending: Pulmonary Disease

## 2015-08-06 VITALS — Ht 70.0 in | Wt 317.0 lb

## 2015-08-06 DIAGNOSIS — G4733 Obstructive sleep apnea (adult) (pediatric): Secondary | ICD-10-CM | POA: Insufficient documentation

## 2015-08-06 DIAGNOSIS — R0683 Snoring: Secondary | ICD-10-CM | POA: Insufficient documentation

## 2015-08-06 DIAGNOSIS — G4736 Sleep related hypoventilation in conditions classified elsewhere: Secondary | ICD-10-CM | POA: Insufficient documentation

## 2015-08-06 DIAGNOSIS — G4761 Periodic limb movement disorder: Secondary | ICD-10-CM | POA: Insufficient documentation

## 2015-08-06 DIAGNOSIS — G4730 Sleep apnea, unspecified: Secondary | ICD-10-CM

## 2015-08-06 DIAGNOSIS — I493 Ventricular premature depolarization: Secondary | ICD-10-CM | POA: Insufficient documentation

## 2015-08-10 ENCOUNTER — Ambulatory Visit (INDEPENDENT_AMBULATORY_CARE_PROVIDER_SITE_OTHER): Payer: Self-pay | Admitting: Adult Health

## 2015-08-10 ENCOUNTER — Telehealth: Payer: Self-pay | Admitting: Pulmonary Disease

## 2015-08-10 ENCOUNTER — Encounter: Payer: Self-pay | Admitting: Adult Health

## 2015-08-10 VITALS — BP 130/86 | HR 100 | Ht 67.0 in | Wt 322.6 lb

## 2015-08-10 DIAGNOSIS — G47419 Narcolepsy without cataplexy: Secondary | ICD-10-CM | POA: Insufficient documentation

## 2015-08-10 DIAGNOSIS — G4733 Obstructive sleep apnea (adult) (pediatric): Secondary | ICD-10-CM

## 2015-08-10 NOTE — Assessment & Plan Note (Signed)
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  Great job  On not smoking  NO DRIVING We will call with sleep study results.  follow up Dr. Vassie Loll  In 2 months and As needed

## 2015-08-10 NOTE — Patient Instructions (Addendum)
Continue on BIPAP At bedtime   Wear every night for at least 6 hrs  Work on weight loss Schedule naps during the daytime , wear your BIPAP if you nap.  Great job  On not smoking  NO DRIVING We will call with sleep study results.  follow up Dr. Vassie Loll  In 2 months and As needed   Download in 1 month .

## 2015-08-10 NOTE — Telephone Encounter (Signed)
Per 9/29 OV w/ TP: follow up Dr. Vassie Loll  In 2 months and As needed ---  Please advise where pt can be worked in at? thanks

## 2015-08-10 NOTE — Assessment & Plan Note (Signed)
Continue on BIPAP At bedtime   Wear every night for at least 6 hrs  Work on weight loss Schedule naps during the daytime , wear your BIPAP if you nap.  Great job  On not smoking  NO DRIVING We will call with sleep study results.  follow up Dr. Vassie Loll  In 2 months and As needed

## 2015-08-10 NOTE — Progress Notes (Signed)
   Subjective:    Patient ID: Darryl Moody, male    DOB: 12/20/1980, 34 y.o.   MRN: [id number]  HPI 34 yo with polysubstance abuse (cocaine)  with narcolepsy and OHS/OSA seen for pulmonary consult during hospital admit 07/27/15 .   08/10/2015 Follow up : Post hospital follow up  Pt returns for a post hospital follow up .  Pt was admitted 9/15 to 9/20 for acute encephlopathy with acute on chronic hypercarbic RF He required intbuation initially . He was kept on BIPAP At bedtime w/ 2 l/m o2.  Was dx with OSA and narcolepsy  3 yrs ago in prison . Had hyperamonemia with ABD [place] and Hepatic panel that were neg.  Drug screen was positive for cocaine.  Has kids ages 68 and 5 , wife lives at home .  Says he drifts off easily during day , chronic problem over last 36yrs.  Got some better few years ago when he lost 60lbs.  Wearing BIPAP most nights. For at least 6hr .  Had Sleep study on 9/25 , results not available at today's visit.  We discussed no driving and wt loss.  Suspected to have probable  pulmonary HTN , echo showed EF 60%. . Unable to assess RV size  Started on Norvasc  And Clonidine.    Review of Systems Constitutional:   No  weight loss, night sweats,  Fevers, chills,  +fatigue, or  lassitude.  HEENT:   No headaches,  Difficulty swallowing,  Tooth/dental problems, or  Sore throat,                No sneezing, itching, ear ache, nasal congestion, post nasal drip,   CV:  No chest pain,  Orthopnea, PND, swelling in lower extremities, anasarca, dizziness, palpitations, syncope.   GI  No heartburn, indigestion, abdominal pain, nausea, vomiting, diarrhea, change in bowel habits, loss of appetite, bloody stools.   Resp:  .  No chest wall deformity  Skin: no rash or lesions.  GU: no dysuria, change in color of urine, no urgency or frequency.  No flank pain, no hematuria   MS:  No joint pain or swelling.  No decreased range of motion.  No back pain.  Psych:  No change in mood or  affect. No depression or anxiety.  No memory loss.         Objective:   Physical Exam  GEN: A/Ox3; pleasant , NAD, mobidly obese   HEENT:  [place]/AT,  EACs-clear, TMs-wnl, NOSE-clear, THROAT-clear, no lesions, no postnasal drip or exudate noted. Class 2-3 airway   NECK:  Supple w/ fair ROM; no JVD; normal carotid impulses w/o bruits; no thyromegaly or nodules palpated; no lymphadenopathy.  RESP  Clear  P & A; w/o, wheezes/ rales/ or rhonchi.no accessory muscle use, no dullness to percussion  CARD:  RRR, no m/r/g  , no peripheral edema, pulses intact, no cyanosis or clubbing.  GI:   Soft & nt; nml bowel sounds; no organomegaly or masses detected.  Musco: Warm bil, no deformities or joint swelling noted.   Neuro: alert, no focal deficits noted.    Skin: Warm, no lesions or rashes        Assessment & Plan:

## 2015-08-11 NOTE — Telephone Encounter (Signed)
Placed patient on Recall for November appointment

## 2015-08-11 NOTE — Telephone Encounter (Signed)
Pl put on list for appt in nov

## 2015-08-11 NOTE — Progress Notes (Signed)
Reviewed & agree with plan  

## 2015-08-12 NOTE — Progress Notes (Signed)
   Patient Name: Darryl, Moody Date: 08/06/2015 Gender: Male D.O.B: October 30, 1981 Age (years): 34 Referring Provider: Alyson Reedy Height (inches): 70 Interpreting Physician: Jetty Duhamel MD, ABSM Weight (lbs): 317 RPSGT: Cherylann Parr BMI: 45 MRN: [id number] Neck Size: 22.00 CLINICAL INFORMATION The patient is referred for a split  CPAP/BiPAP titration to treat sleep apnea.  Date of NPSG, Split Night or HST: Remote  SLEEP STUDY TECHNIQUE As per the AASM Manual for the Scoring of Sleep and Associated Events v2.3 (April 2016) with a hypopnea requiring 4% desaturations.  The channels recorded and monitored were frontal, central and occipital EEG, electrooculogram (EOG), submentalis EMG (chin), nasal and oral airflow, thoracic and abdominal wall motion, anterior tibialis EMG, snore microphone, electrocardiogram, and pulse oximetry. Bilevel positive airway pressure (BPAP) was initiated at the beginning of the study and titrated to treat sleep-disordered breathing.  MEDICATIONS Medications administered by patient during sleep study : No sleep medicine administered.  RESPIRATORY PARAMETERS Optimal IPAP Pressure (cm): 19 AHI at Optimal Pressure (/hr) 82.3 Optimal EPAP Pressure (cm): 15   Overall Minimal O2 (%): 0.00 Minimal O2 at Optimal Pressure (%): 74.0  SLEEP ARCHITECTURE Start Time: 10:57:28 PM Stop Time: 4:53:49 AM Total Time (min): 356.4 Total Sleep Time (min): 330.7 Sleep Latency (min): 1.1 Sleep Efficiency (%): 92.8 REM Latency (min): 209.5 WASO (min): 24.5 Stage N1 (%): 1.81 Stage N2 (%): 78.53 Stage N3 (%): 0.00 Stage R (%): 19.65 Supine (%): 30.98 Arousal Index (/hr): 1.3      CARDIAC DATA The 2 lead EKG demonstrated sinus rhythm. The mean heart rate was 84.17 beats per minute. Other EKG findings include:  PVCs . LEG MOVEMENT DATA The total Periodic Limb Movements of Sleep (PLMS) were 130. The PLMS index was 23.58. A PLMS index of <15 is considered normal in  adults.  IMPRESSIONS Severe obstructive sleep apnea/ hypopnea syndrome, AHI 75.8/ hr Patient could not tolerate CPAP and was changed to [facility]) Best available BiPAP pressure was selected for this patient ( Inspiratory 19 and Expiratory 15 cm of water) AHI 0.0/ hr with room air saturation 85.8% Central sleep apnea was not noted during this titration (CAI = 0.0/h). Severe oxygen desaturations were observed during this titration  The patient snored with Moderate snoring volume. 2-lead EKG demonstrated: PVCs Periodic limb movements were observed during this study. Arousals associated with PLMs were rare.  DIAGNOSIS Obstructive Sleep Apnea (327.23 [G47.33 ICD-10]) Nocturnal Hypoxemia (327.26 [G47.36 ICD-10]) Periodic limb Movement with arousal  RECOMMENDATIONS Trial of BiPAP therapy on 19/15 cm H2O with a Large size Fisher[employer]Paykel Full Face Mask Simplus mask and heated humidification. This patient may need supplemental oxygen during sleep in addition to PAP therapy Avoid alcohol, sedatives and other CNS depressants that may worsen sleep apnea and disrupt normal sleep architecture. Sleep hygiene should be reviewed to assess factors that may improve sleep quality. Weight management and regular exercise should be initiated or continued.   Waymon Budge Diplomate, American Board of Sleep Medicine  ELECTRONICALLY SIGNED ON:  08/12/2015, 1:06 PM [facility] PH: [phone]   FX: [phone] ACCREDITED BY THE AMERICAN ACADEMY OF SLEEP MEDICINE

## 2015-08-13 DIAGNOSIS — G4730 Sleep apnea, unspecified: Secondary | ICD-10-CM

## 2015-09-14 ENCOUNTER — Telehealth: Payer: Self-pay

## 2015-09-14 NOTE — Telephone Encounter (Signed)
Opened in error

## 2015-09-20 ENCOUNTER — Ambulatory Visit (HOSPITAL_BASED_OUTPATIENT_CLINIC_OR_DEPARTMENT_OTHER): Payer: Self-pay

## 2015-10-03 ENCOUNTER — Telehealth: Payer: Self-pay | Admitting: *Deleted

## 2015-10-03 DIAGNOSIS — G4733 Obstructive sleep apnea (adult) (pediatric): Secondary | ICD-10-CM

## 2015-10-03 NOTE — Telephone Encounter (Signed)
According to last OV pt needs download in 1 month. This is now due.  LMTCB to see who DME is

## 2015-10-04 NOTE — Telephone Encounter (Signed)
ATC pt. Mailbox is full. WCB.

## 2015-10-06 NOTE — Telephone Encounter (Signed)
Attempted to contact patient, mailbox full, will have to call back.

## 2015-10-09 NOTE — Telephone Encounter (Signed)
ATC, unable to reach  wcb

## 2015-10-10 NOTE — Telephone Encounter (Signed)
lmtcb for pt.  

## 2015-10-12 NOTE — Addendum Note (Signed)
Addended by: Karalee HeightOX, Fumie Fiallo P on: 10/12/2015 11:36 AM   Modules accepted: Orders

## 2015-10-12 NOTE — Telephone Encounter (Addendum)
LVM for patient to return call  Called pt's wife Crystal. Informed her that we have been trying to contact the patient to confirm his DME so that we could obtain a download. Crystal stated that he uses [facility]. She voiced understanding and had no further questions. Order placed for download. Will sign off on message.

## 2015-12-14 ENCOUNTER — Emergency Department (INDEPENDENT_AMBULATORY_CARE_PROVIDER_SITE_OTHER)
Admission: EM | Admit: 2015-12-14 | Discharge: 2015-12-14 | Disposition: A | Discharge status: Home / Self Care | Payer: Self-pay | Source: Home / Self Care | Attending: Emergency Medicine | Admitting: Emergency Medicine

## 2015-12-14 ENCOUNTER — Other Ambulatory Visit (HOSPITAL_COMMUNITY)
Admission: RE | Admit: 2015-12-14 | Discharge: 2015-12-14 | Disposition: A | Discharge status: Home / Self Care | Payer: Self-pay | Source: Ambulatory Visit | Attending: Emergency Medicine | Admitting: Emergency Medicine

## 2015-12-14 ENCOUNTER — Encounter (HOSPITAL_COMMUNITY): Payer: Self-pay | Admitting: Emergency Medicine

## 2015-12-14 DIAGNOSIS — Z113 Encounter for screening for infections with a predominantly sexual mode of transmission: Secondary | ICD-10-CM | POA: Insufficient documentation

## 2015-12-14 DIAGNOSIS — Z202 Contact with and (suspected) exposure to infections with a predominantly sexual mode of transmission: Secondary | ICD-10-CM

## 2015-12-14 DIAGNOSIS — M65311 Trigger thumb, right thumb: Secondary | ICD-10-CM

## 2015-12-14 HISTORY — DX: Narcolepsy without cataplexy: G47.419

## 2015-12-14 MED ORDER — DICLOFENAC SODIUM 1 % TD GEL: TRANSDERMAL | Status: DC

## 2015-12-14 MED ORDER — METRONIDAZOLE 500 MG PO TABS: ORAL_TABLET | ORAL | Status: DC

## 2015-12-14 NOTE — ED Provider Notes (Signed)
CSN: [id number]     Arrival date & time 12/14/15  1914 History   First MD Initiated Contact with Patient 12/14/15 1955     Chief Complaint  Patient presents with  . Finger Injury  . Exposure to STD   (Consider location/radiation/quality/duration/timing/severity/associated sxs/prior Treatment) HPI He is a 35 year old man here for right thumb problem and exposure to STD. He states a previous partner notified him that she was recently diagnosed and treated for trichomonas. He denies any symptoms currently.  He also states that his right thumb has started locking over the last several days. It is painful at the base of the thumb.  Past Medical History  Diagnosis Date  . Sleep apnea   . Narcolepsy    History reviewed. No pertinent past surgical history. History reviewed. No pertinent family history. Social History  Substance Use Topics  . Smoking status: Former Smoker -- 1.00 packs/day for 25 years    Types: Cigarettes    Quit date: 07/13/2015  . Smokeless tobacco: None  . Alcohol Use: No    Review of Systems As in history of present illness Allergies  Review of patient's allergies indicates no known allergies.  Home Medications   Prior to Admission medications   Medication Sig Start Date End Date Taking? Authorizing Provider  diclofenac sodium (VOLTAREN) 1 % GEL Apply to base or right thumb 4 times a day for 1 week. 12/14/15   Charm Rings, MD  metroNIDAZOLE (FLAGYL) 500 MG tablet Take 4 tablets all at once. 12/14/15   Charm Rings, MD   Meds Ordered and Administered this Visit  Medications - No data to display  BP 128/84 mmHg  Pulse 94  Temp(Src) 98.5 F (36.9 C) (Oral)  Resp 18  SpO2 96% No data found.   Physical Exam  Constitutional: He is oriented to person, place, and time. He appears well-developed and well-nourished. No distress.  Cardiovascular: Normal rate.   Pulmonary/Chest: Effort normal.  Musculoskeletal:  Right thumb: No erythema or edema. He is tender to  palpation at the base of his thumb on the palmar side. He does have trigger thumb.  Neurological: He is alert and oriented to person, place, and time.    ED Course  Procedures (including critical care time)  Labs Review Labs Reviewed  URINE CYTOLOGY ANCILLARY ONLY    Imaging Review No results found.    MDM   1. Trigger thumb, right   2. STD exposure    Urine sent for cytology. Discussed treatment options for trichomonas and patient would like to do one time dose of 2 g of Flagyl.  Prescription given. Discussed that this may make him very nauseous. We'll treat trigger thumb with thumb spica brace and diclofenac gel. Follow-up if not improving in 1 week.    Charm Rings, MD 12/14/15 2021

## 2015-12-14 NOTE — ED Notes (Addendum)
The patient presented to the [facility] with a complaint of a right thumb injury. The patient stated that he did not remember injuring it but that it was swollen x 1 week. The patient also stated that he was exposed to an std and requested testing.

## 2015-12-14 NOTE — Discharge Instructions (Signed)
I have sent your urine for testing. We will call you if anything is positive. Take Flagyl for pills all at once. This will treat Trichomonas. This will likely make you very nauseous. Do not drink alcohol when you take this medicine. You have something called trigger thumb. Wear the brace daily for the next week. Apply Voltaren gel 4 times a day for the next week. If this is not improving in 1 week, please come back.

## 2015-12-15 LAB — URINE CYTOLOGY ANCILLARY ONLY
Chlamydia: NEGATIVE
Neisseria Gonorrhea: NEGATIVE
TRICH (WINDOWPATH): NEGATIVE

## 2016-01-02 ENCOUNTER — Telehealth (HOSPITAL_COMMUNITY): Payer: Self-pay | Admitting: Emergency Medicine

## 2016-01-02 NOTE — ED Notes (Signed)
Pt called back Called pt and notified of recent lab results from visit 2/2 Pt ID'd properly.  Per Dr. Dayton Scrape,  Please let patient know that chlamydia/gonorrhea/trichomonas tests are negative. LM  Adv pt if sx are not getting better to return  Education on safe sex given Pt verb understanding.

## 2016-01-02 NOTE — ED Notes (Signed)
x1 attempt LM on pt's VM Need to give lab results from recent visit on 2/2  Per Dr. Dayton Scrape,  Please let patient know that chlamydia/gonorrhea/trichomonas tests are negative. LM  Will try later.

## 2016-06-14 ENCOUNTER — Emergency Department (HOSPITAL_COMMUNITY): Payer: Self-pay

## 2016-06-14 ENCOUNTER — Encounter (HOSPITAL_COMMUNITY): Payer: Self-pay | Admitting: Emergency Medicine

## 2016-06-14 ENCOUNTER — Inpatient Hospital Stay (HOSPITAL_COMMUNITY)
Admission: EM | Admit: 2016-06-14 | Discharge: 2016-06-17 | DRG: 208 | Disposition: A | Discharge status: Home / Self Care | Payer: Self-pay | Attending: Pulmonary Disease | Admitting: Pulmonary Disease

## 2016-06-14 ENCOUNTER — Inpatient Hospital Stay (HOSPITAL_COMMUNITY): Payer: Self-pay

## 2016-06-14 DIAGNOSIS — Z452 Encounter for adjustment and management of vascular access device: Secondary | ICD-10-CM

## 2016-06-14 DIAGNOSIS — Z6841 Body Mass Index (BMI) 40.0 and over, adult: Secondary | ICD-10-CM

## 2016-06-14 DIAGNOSIS — R451 Restlessness and agitation: Secondary | ICD-10-CM | POA: Diagnosis not present

## 2016-06-14 DIAGNOSIS — J9620 Acute and chronic respiratory failure, unspecified whether with hypoxia or hypercapnia: Secondary | ICD-10-CM

## 2016-06-14 DIAGNOSIS — G9341 Metabolic encephalopathy: Secondary | ICD-10-CM | POA: Diagnosis present

## 2016-06-14 DIAGNOSIS — Z9119 Patient's noncompliance with other medical treatment and regimen: Secondary | ICD-10-CM

## 2016-06-14 DIAGNOSIS — J9622 Acute and chronic respiratory failure with hypercapnia: Principal | ICD-10-CM

## 2016-06-14 DIAGNOSIS — G47419 Narcolepsy without cataplexy: Secondary | ICD-10-CM | POA: Diagnosis present

## 2016-06-14 DIAGNOSIS — Z87891 Personal history of nicotine dependence: Secondary | ICD-10-CM

## 2016-06-14 DIAGNOSIS — S62390A Other fracture of second metacarpal bone, right hand, initial encounter for closed fracture: Secondary | ICD-10-CM

## 2016-06-14 DIAGNOSIS — S62300A Unspecified fracture of second metacarpal bone, right hand, initial encounter for closed fracture: Secondary | ICD-10-CM | POA: Diagnosis present

## 2016-06-14 DIAGNOSIS — Z79899 Other long term (current) drug therapy: Secondary | ICD-10-CM

## 2016-06-14 DIAGNOSIS — G4733 Obstructive sleep apnea (adult) (pediatric): Secondary | ICD-10-CM

## 2016-06-14 DIAGNOSIS — J9690 Respiratory failure, unspecified, unspecified whether with hypoxia or hypercapnia: Secondary | ICD-10-CM

## 2016-06-14 DIAGNOSIS — E876 Hypokalemia: Secondary | ICD-10-CM | POA: Diagnosis present

## 2016-06-14 HISTORY — DX: Obesity, unspecified: E66.9

## 2016-06-14 LAB — BASIC METABOLIC PANEL
ANION GAP: 8 (ref 5–15)
BUN: 7 mg/dL (ref 6–20)
CALCIUM: 9.2 mg/dL (ref 8.9–10.3)
CO2: 29 mmol/L (ref 22–32)
Chloride: 102 mmol/L (ref 101–111)
Creatinine, Ser: 1.06 mg/dL (ref 0.61–1.24)
GLUCOSE: 137 mg/dL — AB (ref 65–99)
POTASSIUM: 3.7 mmol/L (ref 3.5–5.1)
Sodium: 139 mmol/L (ref 135–145)

## 2016-06-14 LAB — PROTIME-INR
INR: 1.25
PROTHROMBIN TIME: 15.8 s — AB (ref 11.4–15.2)

## 2016-06-14 LAB — POCT I-STAT 3, ART BLOOD GAS (G3+)
Acid-Base Excess: 4 mmol/L — ABNORMAL HIGH (ref 0.0–2.0)
Bicarbonate: 29.7 mEq/L — ABNORMAL HIGH (ref 20.0–24.0)
O2 SAT: 100 %
PCO2 ART: 45.8 mmHg — AB (ref 35.0–45.0)
PO2 ART: 222 mmHg — AB (ref 80.0–100.0)
Patient temperature: 98.5
TCO2: 31 mmol/L (ref 0–100)
pH, Arterial: 7.42 (ref 7.350–7.450)

## 2016-06-14 LAB — CBC
HCT: 46.9 % (ref 39.0–52.0)
HEMOGLOBIN: 15.3 g/dL (ref 13.0–17.0)
MCH: 29.8 pg (ref 26.0–34.0)
MCHC: 32.6 g/dL (ref 30.0–36.0)
MCV: 91.2 fL (ref 78.0–100.0)
PLATELETS: 272 10*3/uL (ref 150–400)
RBC: 5.14 MIL/uL (ref 4.22–5.81)
RDW: 13.7 % (ref 11.5–15.5)
WBC: 4.5 10*3/uL (ref 4.0–10.5)

## 2016-06-14 LAB — STREP PNEUMONIAE URINARY ANTIGEN: Strep Pneumo Urinary Antigen: NEGATIVE

## 2016-06-14 LAB — COMPREHENSIVE METABOLIC PANEL
ALBUMIN: 4 g/dL (ref 3.5–5.0)
ALT: 21 U/L (ref 17–63)
ANION GAP: 6 (ref 5–15)
AST: 26 U/L (ref 15–41)
Alkaline Phosphatase: 50 U/L (ref 38–126)
BILIRUBIN TOTAL: 0.4 mg/dL (ref 0.3–1.2)
BUN: 7 mg/dL (ref 6–20)
CALCIUM: 9.4 mg/dL (ref 8.9–10.3)
CO2: 30 mmol/L (ref 22–32)
Chloride: 104 mmol/L (ref 101–111)
Creatinine, Ser: 1.05 mg/dL (ref 0.61–1.24)
GFR calc non Af Amer: >60 mL/min (ref >60)
GLUCOSE: 149 mg/dL — AB (ref 65–99)
POTASSIUM: 3.8 mmol/L (ref 3.5–5.1)
SODIUM: 140 mmol/L (ref 135–145)
TOTAL PROTEIN: 7.4 g/dL (ref 6.5–8.1)

## 2016-06-14 LAB — ETHANOL: Alcohol, Ethyl (B): <5 mg/dL (ref <5)

## 2016-06-14 LAB — RAPID URINE DRUG SCREEN, HOSP PERFORMED
Amphetamines: NOT DETECTED
BENZODIAZEPINES: NOT DETECTED
Barbiturates: NOT DETECTED
COCAINE: POSITIVE — AB
OPIATES: NOT DETECTED
TETRAHYDROCANNABINOL: NOT DETECTED

## 2016-06-14 LAB — I-STAT ARTERIAL BLOOD GAS, ED
Acid-Base Excess: 4 mmol/L — ABNORMAL HIGH (ref 0.0–2.0)
Bicarbonate: 32.4 mEq/L — ABNORMAL HIGH (ref 20.0–24.0)
O2 Saturation: 100 %
PCO2 ART: 66.7 mmHg — AB (ref 35.0–45.0)
PH ART: 7.295 — AB (ref 7.350–7.450)
TCO2: 34 mmol/L (ref 0–100)
pO2, Arterial: 223 mmHg — ABNORMAL HIGH (ref 80.0–100.0)

## 2016-06-14 LAB — TSH: TSH: 0.521 u[IU]/mL (ref 0.350–4.500)

## 2016-06-14 LAB — CORTISOL: CORTISOL PLASMA: 14.2 ug/dL

## 2016-06-14 LAB — LACTIC ACID, PLASMA
LACTIC ACID, VENOUS: 1.5 mmol/L (ref 0.5–1.9)
Lactic Acid, Venous: 1.4 mmol/L (ref 0.5–1.9)

## 2016-06-14 LAB — MRSA PCR SCREENING: MRSA by PCR: NEGATIVE

## 2016-06-14 LAB — CBC WITH DIFFERENTIAL/PLATELET
BASOS ABS: 0 10*3/uL (ref 0.0–0.1)
BASOS PCT: 1 %
Eosinophils Absolute: 0.2 10*3/uL (ref 0.0–0.7)
Eosinophils Relative: 4 %
HEMATOCRIT: 46.5 % (ref 39.0–52.0)
Hemoglobin: 15.4 g/dL (ref 13.0–17.0)
LYMPHS PCT: 40 %
Lymphs Abs: 1.9 10*3/uL (ref 0.7–4.0)
MCH: 30.1 pg (ref 26.0–34.0)
MCHC: 33.1 g/dL (ref 30.0–36.0)
MCV: 91 fL (ref 78.0–100.0)
MONO ABS: 0.3 10*3/uL (ref 0.1–1.0)
Monocytes Relative: 7 %
NEUTROS ABS: 2.4 10*3/uL (ref 1.7–7.7)
NEUTROS PCT: 48 %
Platelets: 283 10*3/uL (ref 150–400)
RBC: 5.11 MIL/uL (ref 4.22–5.81)
RDW: 13.7 % (ref 11.5–15.5)
WBC: 4.9 10*3/uL (ref 4.0–10.5)

## 2016-06-14 LAB — CBG MONITORING, ED: Glucose-Capillary: 137 mg/dL — ABNORMAL HIGH (ref 65–99)

## 2016-06-14 LAB — GLUCOSE, CAPILLARY: Glucose-Capillary: 122 mg/dL — ABNORMAL HIGH (ref 65–99)

## 2016-06-14 LAB — BRAIN NATRIURETIC PEPTIDE: B Natriuretic Peptide: 15.8 pg/mL (ref 0.0–100.0)

## 2016-06-14 LAB — PHOSPHORUS: PHOSPHORUS: 4.1 mg/dL (ref 2.5–4.6)

## 2016-06-14 LAB — APTT: aPTT: 30 seconds (ref 24–36)

## 2016-06-14 LAB — MAGNESIUM: Magnesium: 2.1 mg/dL (ref 1.7–2.4)

## 2016-06-14 LAB — TRIGLYCERIDES: Triglycerides: 173 mg/dL — ABNORMAL HIGH (ref <150)

## 2016-06-14 LAB — PROCALCITONIN

## 2016-06-14 MED ORDER — NALOXONE HCL 0.4 MG/ML IJ SOLN
INTRAMUSCULAR | Status: AC
  Filled 2016-06-14: qty 1

## 2016-06-14 MED ORDER — ETOMIDATE 2 MG/ML IV SOLN
INTRAVENOUS | Status: AC | PRN
  Administered 2016-06-14: 20 mg via INTRAVENOUS

## 2016-06-14 MED ORDER — ROCURONIUM BROMIDE 50 MG/5ML IV SOLN
INTRAVENOUS | Status: AC | PRN
  Administered 2016-06-14: 100 mg via INTRAVENOUS

## 2016-06-14 MED ORDER — MIDAZOLAM HCL 2 MG/2ML IJ SOLN: 2.0000 mg | Freq: Once | INTRAMUSCULAR | Status: DC

## 2016-06-14 MED ORDER — FENTANYL CITRATE (PF) 100 MCG/2ML IJ SOLN: 100.0000 ug | INTRAMUSCULAR | Status: DC | PRN

## 2016-06-14 MED ORDER — FENTANYL CITRATE (PF) 100 MCG/2ML IJ SOLN
INTRAMUSCULAR | Status: AC
  Filled 2016-06-14: qty 2

## 2016-06-14 MED ORDER — SODIUM CHLORIDE 0.9 % IV SOLN
INTRAVENOUS | Status: DC
  Administered 2016-06-14: 11:00:00 via INTRAVENOUS
  Administered 2016-06-15: 50 mL/h via INTRAVENOUS

## 2016-06-14 MED ORDER — NALOXONE HCL 0.4 MG/ML IJ SOLN
0.4000 mg | Freq: Once | INTRAMUSCULAR | Status: AC
  Administered 2016-06-14: 0.4 mg via INTRAVENOUS

## 2016-06-14 MED ORDER — VECURONIUM BROMIDE 10 MG IV SOLR
8.0000 mg | Freq: Once | INTRAVENOUS | Status: AC
  Administered 2016-06-14: 8 mg via INTRAVENOUS

## 2016-06-14 MED ORDER — IPRATROPIUM-ALBUTEROL 0.5-2.5 (3) MG/3ML IN SOLN
3.0000 mL | Freq: Four times a day (QID) | RESPIRATORY_TRACT | Status: DC
  Administered 2016-06-14 – 2016-06-17 (×8): 3 mL via RESPIRATORY_TRACT
  Filled 2016-06-14 (×12): qty 3

## 2016-06-14 MED ORDER — MIDAZOLAM HCL 5 MG/5ML IJ SOLN
INTRAMUSCULAR | Status: AC | PRN
  Administered 2016-06-14: 2 mg via INTRAVENOUS

## 2016-06-14 MED ORDER — FENTANYL CITRATE (PF) 100 MCG/2ML IJ SOLN
100.0000 ug | Freq: Once | INTRAMUSCULAR | Status: DC
  Filled 2016-06-14: qty 2

## 2016-06-14 MED ORDER — FAMOTIDINE IN NACL 20-0.9 MG/50ML-% IV SOLN
20.0000 mg | Freq: Two times a day (BID) | INTRAVENOUS | Status: DC
  Administered 2016-06-14 – 2016-06-15 (×4): 20 mg via INTRAVENOUS
  Filled 2016-06-14 (×5): qty 50

## 2016-06-14 MED ORDER — VECURONIUM BROMIDE 10 MG IV SOLR
INTRAVENOUS | Status: AC
  Filled 2016-06-14: qty 10

## 2016-06-14 MED ORDER — CETYLPYRIDINIUM CHLORIDE 0.05 % MT LIQD
7.0000 mL | Freq: Two times a day (BID) | OROMUCOSAL | Status: DC
  Administered 2016-06-14 (×2): 7 mL via OROMUCOSAL

## 2016-06-14 MED ORDER — MIDAZOLAM HCL 2 MG/2ML IJ SOLN
2.0000 mg | INTRAMUSCULAR | Status: DC | PRN
  Administered 2016-06-14 (×2): 2 mg via INTRAVENOUS
  Filled 2016-06-14 (×4): qty 2

## 2016-06-14 MED ORDER — FENTANYL BOLUS VIA INFUSION
50.0000 ug | INTRAVENOUS | Status: DC | PRN
  Filled 2016-06-14: qty 50

## 2016-06-14 MED ORDER — CETIRIZINE HCL 10 MG PO TABS: 10.0000 mg | ORAL_TABLET | Freq: Every day | ORAL | 0 refills | Status: DC | PRN

## 2016-06-14 MED ORDER — MIDAZOLAM HCL 2 MG/2ML IJ SOLN
INTRAMUSCULAR | Status: AC
  Filled 2016-06-14: qty 2

## 2016-06-14 MED ORDER — MIDAZOLAM HCL 2 MG/2ML IJ SOLN
2.0000 mg | INTRAMUSCULAR | Status: DC | PRN
  Administered 2016-06-14 (×2): 2 mg via INTRAVENOUS

## 2016-06-14 MED ORDER — NAPROXEN 500 MG PO TABS: 500.0000 mg | ORAL_TABLET | Freq: Two times a day (BID) | ORAL | 0 refills | Status: DC

## 2016-06-14 MED ORDER — HEPARIN SODIUM (PORCINE) 5000 UNIT/ML IJ SOLN
5000.0000 [IU] | Freq: Three times a day (TID) | INTRAMUSCULAR | Status: DC
  Administered 2016-06-14 – 2016-06-15 (×4): 5000 [IU] via SUBCUTANEOUS
  Filled 2016-06-14 (×8): qty 1

## 2016-06-14 MED ORDER — CHLORHEXIDINE GLUCONATE 0.12 % MT SOLN
15.0000 mL | Freq: Two times a day (BID) | OROMUCOSAL | Status: DC
  Administered 2016-06-14: 15 mL via OROMUCOSAL

## 2016-06-14 MED ORDER — ANTISEPTIC ORAL RINSE SOLUTION (CORINZ)
7.0000 mL | Freq: Four times a day (QID) | OROMUCOSAL | Status: DC
  Administered 2016-06-15 (×3): 7 mL via OROMUCOSAL

## 2016-06-14 MED ORDER — NALOXONE HCL 2 MG/2ML IJ SOSY
1.0000 mg | PREFILLED_SYRINGE | Freq: Once | INTRAMUSCULAR | Status: AC
  Administered 2016-06-14: 1 mg via INTRAVENOUS

## 2016-06-14 MED ORDER — FENTANYL CITRATE (PF) 100 MCG/2ML IJ SOLN: 50.0000 ug | Freq: Once | INTRAMUSCULAR | Status: DC

## 2016-06-14 MED ORDER — PROPOFOL 1000 MG/100ML IV EMUL
INTRAVENOUS | Status: AC
  Filled 2016-06-14: qty 100

## 2016-06-14 MED ORDER — NALOXONE HCL 2 MG/2ML IJ SOSY
PREFILLED_SYRINGE | INTRAMUSCULAR | Status: AC
  Filled 2016-06-14: qty 2

## 2016-06-14 MED ORDER — NAPROXEN 500 MG PO TABS
500.0000 mg | ORAL_TABLET | Freq: Once | ORAL | Status: DC
  Filled 2016-06-14: qty 2

## 2016-06-14 MED ORDER — PROPOFOL 1000 MG/100ML IV EMUL
0.0000 ug/kg/min | INTRAVENOUS | Status: DC
  Administered 2016-06-14 (×3): 50 ug/kg/min via INTRAVENOUS
  Administered 2016-06-14: 23.261 ug/kg/min via INTRAVENOUS
  Administered 2016-06-14 – 2016-06-15 (×5): 50 ug/kg/min via INTRAVENOUS
  Administered 2016-06-15: 30 ug/kg/min via INTRAVENOUS
  Filled 2016-06-14 (×11): qty 100

## 2016-06-14 MED ORDER — CHLORHEXIDINE GLUCONATE 0.12% ORAL RINSE (MEDLINE KIT)
15.0000 mL | Freq: Two times a day (BID) | OROMUCOSAL | Status: DC
  Administered 2016-06-14 – 2016-06-15 (×2): 15 mL via OROMUCOSAL

## 2016-06-14 MED ORDER — SODIUM CHLORIDE 0.9 % IV SOLN
25.0000 ug/h | INTRAVENOUS | Status: DC
  Administered 2016-06-14: 25 ug/h via INTRAVENOUS
  Filled 2016-06-14: qty 50

## 2016-06-14 MED ORDER — FENTANYL CITRATE (PF) 100 MCG/2ML IJ SOLN
INTRAMUSCULAR | Status: AC | PRN
  Administered 2016-06-14: 100 ug via INTRAVENOUS

## 2016-06-14 MED ORDER — FLUTICASONE PROPIONATE 50 MCG/ACT NA SUSP: 2.0000 | Freq: Every day | NASAL | 0 refills | Status: DC | PRN

## 2016-06-14 MED ORDER — FUROSEMIDE 10 MG/ML IJ SOLN
40.0000 mg | Freq: Once | INTRAMUSCULAR | Status: AC
  Administered 2016-06-14: 40 mg via INTRAVENOUS
  Filled 2016-06-14: qty 4

## 2016-06-14 MED ORDER — FENTANYL CITRATE (PF) 100 MCG/2ML IJ SOLN
100.0000 ug | INTRAMUSCULAR | Status: DC | PRN
  Filled 2016-06-14: qty 2

## 2016-06-14 NOTE — ED Notes (Signed)
Ortho tech paged  

## 2016-06-14 NOTE — ED Notes (Signed)
Patient placed on BiPap.

## 2016-06-14 NOTE — Code Documentation (Signed)
MD at bedside with scope . Tube being placed

## 2016-06-14 NOTE — ED Notes (Signed)
MD aware of o2. MD at bed side,
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
## 2016-06-14 NOTE — Code Documentation (Signed)
Critical Care at the head of the bed.

## 2016-06-14 NOTE — ED Provider Notes (Signed)
Pt seen here originally by me this AM for hand fracture. I was notified during pt's discharge process he is now very sleepy and hard to arouse. He responds to pain as well as intermittently will wake spontaneously, will remain awake for a second, and go back to sleep. Will not respond to commands. He is apneic. He is being moved to a trauma room for further therapy and evaluation. He is placed on BiPAP. GCS now 7. Not responding to narcan.   Physical Exam  BP 145/96   Pulse 93   Temp 99.1 F (37.3 C) (Oral)   Resp (!) 32   Ht 5\' 10"  (1.778 m)   Wt (!) 143.3 kg   SpO2 98%   BMI 45.34 kg/m   Physical Exam  Constitutional:  Periods of apnea Not responsive to most verbal stimuli  HENT:  Head: Atraumatic.  Right Ear: External ear normal.  Left Ear: External ear normal.  Neck:  obese  Cardiovascular: Normal rate, regular rhythm and normal heart sounds.   Pulmonary/Chest:  Periods of apnea Minimal air movement   Abdominal: Soft.  Musculoskeletal: He exhibits no edema.  Neurological: He is unresponsive.  Skin: Skin is warm and dry.  Nursing note and vitals reviewed.     ED Course  Procedures  Results for orders placed or performed during the hospital encounter of 06/14/16  CBC with Differential/Platelet  Result Value Ref Range   WBC 4.9 4.0 - 10.5 K/uL   RBC 5.11 4.22 - 5.81 MIL/uL   Hemoglobin 15.4 13.0 - 17.0 g/dL   HCT 36.1 22.4 - 49.7 %   MCV 91.0 78.0 - 100.0 fL   MCH 30.1 26.0 - 34.0 pg   MCHC 33.1 30.0 - 36.0 g/dL   RDW 53.0 05.1 - 10.2 %   Platelets 283 150 - 400 K/uL   Neutrophils Relative % 48 %   Neutro Abs 2.4 1.7 - 7.7 K/uL   Lymphocytes Relative 40 %   Lymphs Abs 1.9 0.7 - 4.0 K/uL   Monocytes Relative 7 %   Monocytes Absolute 0.3 0.1 - 1.0 K/uL   Eosinophils Relative 4 %   Eosinophils Absolute 0.2 0.0 - 0.7 K/uL   Basophils Relative 1 %   Basophils Absolute 0.0 0.0 - 0.1 K/uL  Basic metabolic panel  Result Value Ref Range   Sodium 139 135 - 145  mmol/L   Potassium 3.7 3.5 - 5.1 mmol/L   Chloride 102 101 - 111 mmol/L   CO2 29 22 - 32 mmol/L   Glucose, Bld 137 (H) 65 - 99 mg/dL   BUN 7 6 - 20 mg/dL   Creatinine, Ser 1.11 0.61 - 1.24 mg/dL   Calcium 9.2 8.9 - 73.5 mg/dL   GFR calc non Af Amer >60 >60 mL/min   GFR calc Af Amer >60 >60 mL/min   Anion gap 8 5 - 15  Ethanol  Result Value Ref Range   Alcohol, Ethyl (B) <5 <5 mg/dL  I-Stat Arterial Blood Gas, ED - (order at [facility] and [facility] only)  Result Value Ref Range   pH, Arterial 7.295 (L) 7.350 - 7.450   pCO2 arterial 66.7 (HH) 35.0 - 45.0 mmHg   pO2, Arterial 223.0 (H) 80.0 - 100.0 mmHg   Bicarbonate 32.4 (H) 20.0 - 24.0 mEq/L   TCO2 34 0 - 100 mmol/L   O2 Saturation 100.0 %   Acid-Base Excess 4.0 (H) 0.0 - 2.0 mmol/L   Patient temperature 98.6 F    Collection  site RADIAL, ALLEN'S TEST ACCEPTABLE    Drawn by Operator    Sample type ARTERIAL    Comment NOTIFIED PHYSICIAN   POC CBG, ED  Result Value Ref Range   Glucose-Capillary 137 (H) 65 - 99 mg/dL   Dg Chest Portable 1 View  Result Date: 06/14/2016 CLINICAL DATA:  Decreased oxygen saturation. Narcolepsy. Shortness of breath EXAM: PORTABLE CHEST 1 VIEW COMPARISON:  July 28, 2015 FINDINGS: No edema or consolidation. Heart is upper normal in size with pulmonary vascularity within normal limits. No adenopathy. No pneumothorax. No bone lesions. IMPRESSION: No edema or consolidation. Electronically Signed   By: Bretta Bang III M.D.   On: 06/14/2016 09:24   Dg Hand Complete Right  Result Date: 06/14/2016 CLINICAL DATA:  Seward Carol somebody 2 days ago, knuckle swelling. EXAM: RIGHT HAND - COMPLETE 3+ VIEW COMPARISON:  None. FINDINGS: Acute oblique nondisplaced fracture distal aspect second metacarpus, in alignment. No intra-articular extension. No destructive bony lesions. No dislocation. Mild marginal spurring first interphalangeal joint space compatible with osteoarthrosis. Dorsal hand soft tissue swelling without subcutaneous  gas or radiopaque foreign bodies. IMPRESSION: Acute nondisplaced second metacarpus fracture. Electronically Signed   By: Awilda Metro M.D.   On: 06/14/2016 06:39     MDM Suspect acute on chronic respiratory failure due in this obese patient with history of sleep apnea and narcolepsy. Earlier today he admitted to me noncompliance with CPAP at home. Pt continues to have minimal air movement on BiPAP. ABG abnormal though similar to prior seen in September 2016. CXR negative for acute findings. Pt now seen in conjunction with Dr. Criss Alvine. He will consult PCCM.   Pt now intubated by and in the care of PCCM who will admit pt. Appreciate assistance.  1. Respiratory failure  2. Obstructive sleep apnea   3. Closed nondisplaced fracture of right second metacarpal        Carlene Coria, PA-C 06/14/16 0943    Carlene Coria, PA-C 06/14/16 6045    Pricilla Loveless, MD 06/18/16 [phone]

## 2016-06-14 NOTE — ED Provider Notes (Signed)
8:55 AM I assumed care of patient at around 8 AM. Briefly, patient came in for hand fracture but was very hard to arouse upon discharge. Review of chart shows history of narcolepsy and sleep apnea. He does not seem compliant with his CPAP. He will intimately wake up but would not really follow commands. Placed on BiPAP but initial blood gases acidotic. I reviewed his chart from September 2016 and he had a similar blood gas and ended up being intubated. I'm concerned that that is where is going currently. He is currently not hypoxic and is getting a little bit better volumes on his BiPAP so we'll continue this for a little bit. However I consulted PCCM to admit. Dr. Isaiah Serge consulted and team will see.  ICU to admit given poor air movement and he is unlikely to turn around. They will admit.  CRITICAL CARE Performed by: Pricilla Loveless T   Total critical care time: 40 minutes  Critical care time was exclusive of separately billable procedures and treating other patients.  Critical care was necessary to treat or prevent imminent or life-threatening deterioration.  Critical care was time spent personally by me on the following activities: development of treatment plan with patient and/or surrogate as well as nursing, discussions with consultants, evaluation of patient's response to treatment, examination of patient, obtaining history from patient or surrogate, ordering and performing treatments and interventions, ordering and review of laboratory studies, ordering and review of radiographic studies, pulse oximetry and re-evaluation of patient's condition.    Pricilla Loveless, MD 06/14/16 [phone]

## 2016-06-14 NOTE — ED Notes (Signed)
Report given to Christen RN

## 2016-06-14 NOTE — Procedures (Signed)
Central Venous Catheter Insertion Procedure Note Darryl Moody [id number] [birth year]  Procedure: Insertion of Central Venous Catheter Indications: Assessment of intravascular volume, Drug and/or fluid administration and Frequent blood sampling  Procedure Details Consent: Unable to obtain consent because of emergent medical necessity. Time Out: Verified patient identification, verified procedure, site/side was marked, verified correct patient position, special equipment/implants available, medications/allergies/relevent history reviewed, required imaging and test results available.  Performed  Maximum sterile technique was used including antiseptics, cap, gloves, gown, hand hygiene, mask and sheet. Skin prep: Chlorhexidine; local anesthetic administered A antimicrobial bonded/coated triple lumen catheter was placed in the left internal jugular vein using the Seldinger technique. Ultrasound guidance used.Yes.   Catheter placed to 20 cm. Blood aspirated via all 3 ports and then flushed x 3. Line sutured x 2 and dressing applied.  Evaluation Blood flow good Complications: No apparent complications Patient did tolerate procedure well. Chest X-ray ordered to verify placement.  CXR: normal.  Brett Canales Minor ACNP [facility] PCCM Pager [phone] till 3 pm If no answer page [phone] 06/14/2016, 12:01 PM
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
## 2016-06-14 NOTE — Code Documentation (Signed)
Endtidal placed color. Bilateral  breathe sounds noted.

## 2016-06-14 NOTE — Progress Notes (Signed)
Pt intubated by CCM MD. 8.0 secured at 25@ lip. Positive color change on ETCO2. BBS equal. Placed on vent with settings of PRVC 600/20/100/5. Will cont to monitor and wean FIO2.

## 2016-06-14 NOTE — Progress Notes (Signed)
Orthopedic Tech Progress Note Patient Details:  Darryl Moody 06-May-1981 [id number]  Ortho Devices Type of Ortho Device: Ace wrap, Rad Gutter splint Ortho Device/Splint Interventions: Application   [name] 06/14/2016, 7:24 AM

## 2016-06-14 NOTE — Code Documentation (Signed)
preparing for intubation. Bipap mask still on

## 2016-06-14 NOTE — ED Notes (Signed)
Patient still unresponsive. No response to narcan .
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
## 2016-06-14 NOTE — Progress Notes (Signed)
Initial Nutrition Assessment  DOCUMENTATION CODES:   Morbid obesity  INTERVENTION:  If unable to extubate, recommend TF via OGT with Vital High Protein at goal rate of 5 ml/h (120 ml per day) with Prostat 60 ml 6 times daily.  Tube feeding regimen with current propofol rate will provide 2453 kcals, 191 gm protein, 101 ml free water daily.  NUTRITION DIAGNOSIS:   Inadequate oral intake related to inability to eat as evidenced by NPO status.  GOAL:   Patient will meet greater than or equal to 90% of their needs  MONITOR:   Vent status, Labs, Weight trends, Skin, I & O's  REASON FOR ASSESSMENT:   Ventilator    ASSESSMENT:   35 Y/O with narcolepsy, OSA (non compliant with CPAP), rt hand injury. Admitted with hypercarbic resp failure. He did not respond to Bipap and was intubated in ED.  Patient is currently intubated on ventilator support MV: 13.5 L/min Temp (24hrs), Avg:99.1 F (37.3 C), Min:99.1 F (37.3 C), Max:99.1 F (37.3 C)  Propofol: 42.9 ml/hr which provides 1133 kcal/day  Plans for extubation in the morning per RN, however if unable to extubate, tube feeding recommendations are stated above.   Pt with no observed significant fat or muscle mass loss.   Labs and medications reviewed.   Diet Order:  Diet NPO time specified  Skin:  Reviewed, no issues  Last BM:  Unknown  Height:   Ht Readings from Last 1 Encounters:  06/14/16 5\' 10"  (1.778 m)    Weight:   Wt Readings from Last 1 Encounters:  06/14/16 (!) 316 lb (143.3 kg)    Ideal Body Weight:  75.45 kg  BMI:  Body mass index is 45.34 kg/m.  Estimated Nutritional Needs:   Kcal:  3875-6433  Protein:  >/= 188 grams  Fluid:  >/= 1.5 L/day  EDUCATION NEEDS:   No education needs identified at this time  Roslyn Smiling, MS, RD, LDN Pager # [phone] After hours/ weekend pager # [phone]

## 2016-06-14 NOTE — Code Documentation (Signed)
Tube  Placed 25 @ Lip.

## 2016-06-14 NOTE — Progress Notes (Signed)
Pt placed on BIPAP for OSA/resp distress. RT is unable to ventilate pt sufficiently. Tidal volumes 100-400 with occasional high volume of 1100 when patient wakes up and takes a deep breath. Will cont to monitor and assess for need if intubation.

## 2016-06-14 NOTE — Procedures (Signed)
Intubation Procedure Note VRAJ TEASE [id number] 1980-11-30  Procedure: Intubation Indications: Airway protection and maintenance  Procedure Details Consent: Risks of procedure as well as the alternatives and risks of each were explained to the (patient/caregiver).  Consent for procedure obtained. Time Out: Verified patient identification, verified procedure, site/side was marked, verified correct patient position, special equipment/implants available, medications/allergies/relevent history reviewed, required imaging and test results available.  Performed  Darryl Moody and 3   Evaluation Hemodynamic Status: BP stable throughout; O2 sats: stable throughout Patient's Current Condition: stable Complications: No apparent complications Patient did tolerate procedure well. Chest X-ray ordered to verify placement.  CXR: pending.   Darryl Moody 06/14/2016

## 2016-06-14 NOTE — Code Documentation (Signed)
Patient being bagged 

## 2016-06-15 DIAGNOSIS — E662 Morbid (severe) obesity with alveolar hypoventilation: Secondary | ICD-10-CM

## 2016-06-15 DIAGNOSIS — J9622 Acute and chronic respiratory failure with hypercapnia: Principal | ICD-10-CM

## 2016-06-15 LAB — CBC
HCT: 46 % (ref 39.0–52.0)
HEMOGLOBIN: 15.3 g/dL (ref 13.0–17.0)
MCH: 29.8 pg (ref 26.0–34.0)
MCHC: 33.3 g/dL (ref 30.0–36.0)
MCV: 89.7 fL (ref 78.0–100.0)
PLATELETS: 289 10*3/uL (ref 150–400)
RBC: 5.13 MIL/uL (ref 4.22–5.81)
RDW: 13.6 % (ref 11.5–15.5)
WBC: 5.7 10*3/uL (ref 4.0–10.5)

## 2016-06-15 LAB — BLOOD GAS, ARTERIAL
ACID-BASE EXCESS: 2.7 mmol/L — AB (ref 0.0–2.0)
Bicarbonate: 26.3 mEq/L — ABNORMAL HIGH (ref 20.0–24.0)
Drawn by: 462031
FIO2: 0.4
LHR: 24 {breaths}/min
MECHVT: 580 mL
O2 SAT: 98.1 %
PATIENT TEMPERATURE: 98.6
PCO2 ART: 37.8 mmHg (ref 35.0–45.0)
PEEP/CPAP: 5 cmH2O
PO2 ART: 109 mmHg — AB (ref 80.0–100.0)
TCO2: 27.5 mmol/L (ref 0–100)
pH, Arterial: 7.457 — ABNORMAL HIGH (ref 7.350–7.450)

## 2016-06-15 LAB — URINE CULTURE
CULTURE: NO GROWTH
SPECIAL REQUESTS: NORMAL

## 2016-06-15 LAB — BASIC METABOLIC PANEL
ANION GAP: 9 (ref 5–15)
BUN: 7 mg/dL (ref 6–20)
CHLORIDE: 105 mmol/L (ref 101–111)
CO2: 26 mmol/L (ref 22–32)
Calcium: 9.3 mg/dL (ref 8.9–10.3)
Creatinine, Ser: 1.37 mg/dL — ABNORMAL HIGH (ref 0.61–1.24)
GFR calc Af Amer: >60 mL/min (ref >60)
GLUCOSE: 115 mg/dL — AB (ref 65–99)
POTASSIUM: 3.6 mmol/L (ref 3.5–5.1)
SODIUM: 140 mmol/L (ref 135–145)

## 2016-06-15 LAB — GLUCOSE, CAPILLARY
GLUCOSE-CAPILLARY: 113 mg/dL — AB (ref 65–99)
GLUCOSE-CAPILLARY: 112 mg/dL — AB (ref 65–99)
Glucose-Capillary: 114 mg/dL — ABNORMAL HIGH (ref 65–99)
Glucose-Capillary: 122 mg/dL — ABNORMAL HIGH (ref 65–99)

## 2016-06-15 NOTE — Procedures (Signed)
Extubation Procedure Note  Patient Details:   Name: Darryl Moody DOB: 26-Dec-1980 MRN: [id number]   Airway Documentation:  Airway 8 mm (Active)  Secured at (cm) 26 cm 06/15/2016  9:15 AM  Measured From Lips 06/15/2016  9:15 AM  Secured Location Center 06/15/2016  9:15 AM  Secured By [employer] 06/15/2016  9:15 AM  Tube Holder Repositioned Yes 06/15/2016  9:15 AM  Cuff Pressure (cm H2O) 24 cm H2O 06/15/2016  3:39 AM  Site Condition Dry 06/15/2016  9:15 AM    Evaluation  O2 sats: stable throughout Complications: No apparent complications Patient did tolerate procedure well. Bilateral Breath Sounds: Clear, Diminished   Yes   Patient was extubated to a 4L [place]. Cuff leak was heard. No stridor was noted. RN at bedside with RT during extubation. Patient was able to vocalize after extubation. RT will continue to monitor.  Danella Maiers Mathayus Stanbery 06/15/2016, 11:21 AM

## 2016-06-15 NOTE — Progress Notes (Signed)
Notified Tammy Parrett NP in regards to patient's sediment and cloudy urine. Will continue to monitor and assess.
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
## 2016-06-15 NOTE — Progress Notes (Signed)
Wasted 125 mls of Fentanyl IV drip and of Propofol IV drip with Weldon Picking RN in sink.

## 2016-06-15 NOTE — Progress Notes (Signed)
Patient has been agitated at times, and very demanding. Wanting to leave AMA. Patient stated is "Im going to leave". At this point, we were able to decrease the situation. Patients brother at bedside, helping decrease the situation.

## 2016-06-15 NOTE — Progress Notes (Addendum)
Patient has cash bedside. Does not want to be locked in safe with security or messed with. Will not allow to count. Passed along to night shift RN.

## 2016-06-16 ENCOUNTER — Inpatient Hospital Stay (HOSPITAL_COMMUNITY): Payer: Self-pay

## 2016-06-16 LAB — BASIC METABOLIC PANEL
Anion gap: 8 (ref 5–15)
BUN: 8 mg/dL (ref 6–20)
CO2: 27 mmol/L (ref 22–32)
Calcium: 8.6 mg/dL — ABNORMAL LOW (ref 8.9–10.3)
Chloride: 105 mmol/L (ref 101–111)
Creatinine, Ser: 1.19 mg/dL (ref 0.61–1.24)
GFR calc Af Amer: >60 mL/min (ref >60)
GFR calc non Af Amer: >60 mL/min (ref >60)
Glucose, Bld: 124 mg/dL — ABNORMAL HIGH (ref 65–99)
Potassium: 3.3 mmol/L — ABNORMAL LOW (ref 3.5–5.1)
Sodium: 140 mmol/L (ref 135–145)

## 2016-06-16 LAB — CBC
HEMATOCRIT: 42.6 % (ref 39.0–52.0)
Hemoglobin: 13.5 g/dL (ref 13.0–17.0)
MCH: 29.1 pg (ref 26.0–34.0)
MCHC: 31.7 g/dL (ref 30.0–36.0)
MCV: 91.8 fL (ref 78.0–100.0)
PLATELETS: 229 10*3/uL (ref 150–400)
RBC: 4.64 MIL/uL (ref 4.22–5.81)
RDW: 14.1 % (ref 11.5–15.5)
WBC: 5.3 10*3/uL (ref 4.0–10.5)

## 2016-06-16 MED ORDER — POTASSIUM CHLORIDE 10 MEQ/100ML IV SOLN: 10.0000 meq | INTRAVENOUS | Status: DC

## 2016-06-16 MED ORDER — POTASSIUM CHLORIDE CRYS ER 20 MEQ PO TBCR
20.0000 meq | EXTENDED_RELEASE_TABLET | Freq: Once | ORAL | Status: AC
  Administered 2016-06-16: 20 meq via ORAL
  Filled 2016-06-16: qty 1

## 2016-06-16 MED ORDER — SODIUM CHLORIDE 0.9% FLUSH
3.0000 mL | Freq: Two times a day (BID) | INTRAVENOUS | Status: DC
  Administered 2016-06-17: 3 mL via INTRAVENOUS

## 2016-06-16 MED ORDER — ANTISEPTIC ORAL RINSE SOLUTION (CORINZ): 7.0000 mL | OROMUCOSAL | Status: DC | PRN

## 2016-06-16 MED ORDER — POTASSIUM CHLORIDE 10 MEQ/50ML IV SOLN
10.0000 meq | INTRAVENOUS | Status: DC
  Administered 2016-06-16 (×2): 10 meq via INTRAVENOUS
  Filled 2016-06-16 (×2): qty 50

## 2016-06-16 MED ORDER — HYDROCODONE-ACETAMINOPHEN 5-325 MG PO TABS
1.0000 | ORAL_TABLET | Freq: Four times a day (QID) | ORAL | Status: DC | PRN
  Administered 2016-06-16: 1 via ORAL
  Filled 2016-06-16: qty 1

## 2016-06-16 NOTE — Progress Notes (Signed)
Pt received from 80M in w/c placed in recliner. NSR 67 BP 137/85 drowsy but arouses. Aware on name, birthday and that he is on the hospital

## 2016-06-16 NOTE — Progress Notes (Signed)
[facility] FOR AM LAB REPLACEMENT ONLY  The patient does not apply for the [facility]ELINK Adult ICU Electrolyte Replacment Protocol based on the criteria listed below:   1. Is GFR >/= 40 ml/min? Yes.    Patient's GFR today is >60 2. Is urine output >/= 0.5 ml/kg/hr for the last 6 hours? No. Patient's UOP is unable to calculate with what is recorded ml/kg/hr 3. Is BUN < 60 mg/dL? Yes.    Patient's BUN today is 8 4. Abnormal electrolyte(s):K+3.3 5. Ordered repletion with: NA 6. If a panic level lab has been reported, has the CCM MD in charge been notified? Yes.  .   Physician:  Arsenio LoaderRamaswamy  [facility] 06/16/2016 5:24 AM

## 2016-06-16 NOTE — Progress Notes (Signed)
Patient placed back on Bipap due to apnea during sleep. Initially patient refused but after some discussion with him he agreed to wear. Patient placed in high fowlers position and respiratory therapy into room placing him on Bipap.  Patient is also requesting to be discharged from hospital.  Will continue to monitor him very closely. Sats now 100% on bipap. During apnic episodes sats dropped to 70 %.

## 2016-06-16 NOTE — Progress Notes (Signed)
Patient ID: Darryl Moody, male   DOB: 12/11/1980, 35 y.o.   MRN: [id number]   eLink Physician Progress Note and Electrolyte Replacement  Patient Name: Darryl AusCorry E Oberman DOB: 12/11/1980 MRN: [id number]  Date of Service  06/16/2016   HPI/Events of Note    Recent Labs Lab 06/14/16 0820 06/14/16 0951 06/15/16 0311 06/16/16 0430  NA 139 140 140 140  K 3.7 3.8 3.6 3.3*  CL 102 104 105 105  CO2 29 30 26 27   GLUCOSE 137* 149* 115* 124*  BUN 7 7 7 8   CREATININE 1.06 1.05 1.37* 1.19  CALCIUM 9.2 9.4 9.3 8.6*  MG  --  2.1  --   --   PHOS  --  4.1  --   --     Estimated Creatinine Clearance: 123.1 mL/min (by C-G formula based on SCr of 1.19 mg/dL).  Intake/Output      08/05 0701 - 08/06 0700   I.V. (mL/kg) 1175.5 (8)   Other 840   IV Piggyback 100   Total Intake(mL/kg) 2115.5 (14.5)   Urine (mL/kg/hr) 850 (0.2)   Total Output 850   Net +1265.5        - I/O DETAILED x 24h    Total I/O In: 550 [I.V.:500; IV Piggyback:50] Out: 500 [Urine:500] - I/O THIS SHIFT    ASSESSMENT Mild low K  eICURN Interventions  kcl 10meq x 4 runs IV   ASSESSMENT: MAJOR ELECTROLYTE      Dr. Kalman ShanMurali Layia Walla, M.D., [facility] Hospital[facility].C.C.P Pulmonary and Critical Care Medicine Staff Physician [facility] Pager: [phone], If no answer or between  15:00h - 7:00h: call [phone]  06/16/2016 5:43 AM

## 2016-06-16 NOTE — Progress Notes (Signed)
Pt is calm and agreeable but non-compliant with safety measures and fall precautions.  Educated on use of call light and not getting up without assistance, pt is non-compliant.  RN will continue to monitor.
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
## 2016-06-17 DIAGNOSIS — J9602 Acute respiratory failure with hypercapnia: Secondary | ICD-10-CM

## 2016-06-17 DIAGNOSIS — G4733 Obstructive sleep apnea (adult) (pediatric): Secondary | ICD-10-CM

## 2016-06-17 LAB — BASIC METABOLIC PANEL
ANION GAP: 7 (ref 5–15)
BUN: 7 mg/dL (ref 6–20)
CALCIUM: 8.8 mg/dL — AB (ref 8.9–10.3)
CO2: 27 mmol/L (ref 22–32)
Chloride: 105 mmol/L (ref 101–111)
Creatinine, Ser: 0.98 mg/dL (ref 0.61–1.24)
GLUCOSE: 133 mg/dL — AB (ref 65–99)
POTASSIUM: 3.6 mmol/L (ref 3.5–5.1)
Sodium: 139 mmol/L (ref 135–145)

## 2016-06-17 LAB — CULTURE, RESPIRATORY W GRAM STAIN

## 2016-06-17 LAB — CULTURE, RESPIRATORY: SPECIAL REQUESTS: NORMAL

## 2016-06-17 MED ORDER — IPRATROPIUM-ALBUTEROL 0.5-2.5 (3) MG/3ML IN SOLN: 3.0000 mL | Freq: Four times a day (QID) | RESPIRATORY_TRACT | Status: DC | PRN

## 2016-06-17 MED ORDER — DOXYCYCLINE HYCLATE 100 MG PO TABS: 100.0000 mg | ORAL_TABLET | Freq: Two times a day (BID) | ORAL | Status: DC

## 2016-06-17 MED ORDER — LEVOFLOXACIN 750 MG PO TABS: 750.0000 mg | ORAL_TABLET | Freq: Every day | ORAL | 0 refills | Status: DC

## 2016-06-17 MED ORDER — LEVOFLOXACIN 750 MG PO TABS
750.0000 mg | ORAL_TABLET | Freq: Every day | ORAL | Status: DC
  Administered 2016-06-17: 750 mg via ORAL
  Filled 2016-06-17: qty 1

## 2016-06-17 NOTE — Discharge Instructions (Signed)
° °  Cast or Splint Care Casts and splints support injured limbs and keep bones from moving while they heal.  HOME CARE  Keep the cast or splint uncovered during the drying period.  A plaster cast can take 24 to 48 hours to dry.  A fiberglass cast will dry in less than 1 hour.  Do not rest the cast on anything harder than a pillow for 24 hours.  Do not put weight on your injured limb. Do not put pressure on the cast. Wait for your doctor's approval.  Keep the cast or splint dry.  Cover the cast or splint with a plastic bag during baths or wet weather.  If you have a cast over your chest and belly (trunk), take sponge baths until the cast is taken off.  If your cast gets wet, dry it with a towel or blow dryer. Use the cool setting on the blow dryer.  Keep your cast or splint clean. Wash a dirty cast with a damp cloth.  Do not put any objects under your cast or splint.  Do not scratch the skin under the cast with an object. If itching is a problem, use a blow dryer on a cool setting over the itchy area.  Do not trim or cut your cast.  Do not take out the padding from inside your cast.  Exercise your joints near the cast as told by your doctor.  Raise (elevate) your injured limb on 1 or 2 pillows for the first 1 to 3 days. GET HELP IF:  Your cast or splint cracks.  Your cast or splint is too tight or too loose.  You itch badly under the cast.  Your cast gets wet or has a soft spot.  You have a bad smell coming from the cast.  You get an object stuck under the cast.  Your skin around the cast becomes red or sore.  You have new or more pain after the cast is put on. GET HELP RIGHT AWAY IF:  You have fluid leaking through the cast.  You cannot move your fingers or toes.  Your fingers or toes turn blue or white or are cool, painful, or puffy (swollen).  You have tingling or lose feeling (numbness) around the injured area.  You have bad pain or pressure under the  cast.  You have trouble breathing or have shortness of breath.  You have chest pain.   This information is not intended to replace advice given to you by your health care provider. Make sure you discuss any questions you have with your health care provider.   Document Released: 02/27/2011 Document Revised: 06/30/2013 Document Reviewed: 05/06/2013 Elsevier Interactive Patient Education [employer]2016 [employer] You were seen in the emergency room today for evaluation of your right hand. You have a fracture of your second hand bone. We placed you in a splint. Please call Dr. Warren DanesXu's office to schedule orthopedic follow up. In the meantime take Naproxen as needed for pain. Please also call [facility] to get set up with a primary care provider. You will need to follow up with them and/or a pulmonology specialist regarding your sleep apnea and narcolepsy. I will give you a couple prescriptions to help with your nasal congestion today. Return to the ER for new or worsening symptoms.
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
## 2016-06-17 NOTE — Progress Notes (Signed)
Wife brought clothes for discharge. Went over meds again with wife when she got here. Aware that they need to make follow up appointments to see ortho and discharging DR. Has all paperwork and personal belongings. Pt has cash and debit card - had reiused to let staff count it or lock it up. He has his cell phone and charger. Discharged per w/c by Nurse tech. Accompanied by wife and son via private vehicle

## 2016-06-17 NOTE — Progress Notes (Signed)
Orthopedic Tech Progress Note Patient Details:  Darryl Moody 16-Mar-1981 [id number]  Ortho Devices Type of Ortho Device: Ace wrap, Post (short arm) splint Ortho Device/Splint Location: Replacement Radial gutter Ortho Device/Splint Interventions: Application   Saul FordyceJennifer C Kynzley Dowson 06/17/2016, 12:28 PM

## 2016-06-17 NOTE — Care Management Note (Addendum)
Case Management Note  Patient Details  Name: Chelsea AusCorry E Coffel MRN: [id number] Date of Birth: [birth year]  Subjective/Objective:  CSW notified CM that she had received request to provide pt with CPAP machine.  Pt has his CPAP machine in his room, states he was not using it at home because he no longer had the mask.  Per RT, pt can take the tubing and mask provided by the hospital and use it with his machine when he is discharged.                       Expected Discharge Plan:  Home/Self Care   Discharge planning Services  CM Consult  Status of Service:  Completed, signed off  Hesston Hitchens, Marcelyn BruinsHenrietta T, RN 06/17/2016, 11:48 AM   3:16 PM:  Received referral to arrange home care to check fit of mask when pt discharges home.  Pt refuses home health visit, has no insurance, does agree that RT from [facility] (who supplied the CPAP machine) can visit to check the fit with his home CPAP machine.  [facility].

## 2016-06-17 NOTE — Progress Notes (Signed)
Pt had taken off Rt arm gutter splint and ace wrap - asked pt if splint  hurt him - "No" just didn't want to wear it anymore

## 2016-06-17 NOTE — Progress Notes (Signed)
RN went into pts room at 0200 and found arm cast on the table. Pt removed it himself and does not want to wear it.

## 2016-06-17 NOTE — Progress Notes (Signed)
Ortho tech paged to replace Rt gutter splint

## 2016-06-17 NOTE — Progress Notes (Signed)
Text page to PCCM for pt. He wants to take a shower- Toniann FailBrandi Ollie NP called back made aware of pt's past sputum culture. Also wanted staff to call ortho tech to replace gutter Rt arm splint for nondisplaced fracture.

## 2016-06-17 NOTE — Progress Notes (Signed)
Went over all discharge instructions with pt. Aware he is to take his Levaquin every am around 10 pm. Exit care notes given with explanation on Levaquin. Pt has his own CPAP machine here. Says his mask is broken - will take tubing and mask from the one he has been using here. Case manager will have HH follow up with this.

## 2016-06-17 NOTE — Plan of Care (Signed)
Problem: Safety: Goal: Ability to remain free from injury will improve Outcome: Adequate for Discharge Pt is non-compliant with safety measures, moves about the room freely.  Adequate for discharge.  Problem: Activity: Goal: Risk for activity intolerance will decrease Outcome: Completed/Met Date Met: 06/17/16 Pt able to move around the room independently.

## 2016-06-19 LAB — CULTURE, BLOOD (ROUTINE X 2)
CULTURE: NO GROWTH
Culture: NO GROWTH

## 2016-06-28 ENCOUNTER — Inpatient Hospital Stay: Payer: Self-pay | Admitting: Adult Health

## 2016-10-26 ENCOUNTER — Encounter (HOSPITAL_COMMUNITY): Payer: Self-pay | Admitting: Emergency Medicine

## 2016-10-26 ENCOUNTER — Ambulatory Visit (HOSPITAL_COMMUNITY)
Admission: EM | Admit: 2016-10-26 | Discharge: 2016-10-26 | Disposition: A | Discharge status: Home / Self Care | Payer: Self-pay | Attending: Family Medicine | Admitting: Family Medicine

## 2016-10-26 DIAGNOSIS — J020 Streptococcal pharyngitis: Secondary | ICD-10-CM

## 2016-10-26 LAB — POCT RAPID STREP A: Streptococcus, Group A Screen (Direct): POSITIVE — AB

## 2016-10-26 MED ORDER — DEXAMETHASONE SODIUM PHOSPHATE 10 MG/ML IJ SOLN
10.0000 mg | Freq: Once | INTRAMUSCULAR | Status: AC
  Administered 2016-10-26: 10 mg via INTRAMUSCULAR

## 2016-10-26 MED ORDER — AMOXICILLIN 500 MG PO CAPS: 1000.0000 mg | ORAL_CAPSULE | Freq: Two times a day (BID) | ORAL | 0 refills | Status: DC

## 2016-10-26 MED ORDER — DEXAMETHASONE SODIUM PHOSPHATE 10 MG/ML IJ SOLN
INTRAMUSCULAR | Status: AC
  Filled 2016-10-26: qty 1

## 2016-10-26 MED ORDER — IBUPROFEN 100 MG/5ML PO SUSP
ORAL | Status: AC
  Filled 2016-10-26: qty 40

## 2016-10-26 MED ORDER — IBUPROFEN 100 MG/5ML PO SUSP
800.0000 mg | Freq: Once | ORAL | Status: AC
  Administered 2016-10-26: 800 mg via ORAL

## 2016-10-26 NOTE — ED Provider Notes (Signed)
CSN: [id number]     Arrival date & time 10/26/16  1807 History   First MD Initiated Contact with Patient 10/26/16 1955     Chief Complaint  Patient presents with  . Sore Throat   (Consider location/radiation/quality/duration/timing/severity/associated sxs/prior Treatment) 35 year old male with sleep apnea and narcolepsy presents to the urgent care today with a two-day history of sore throat. He states he has had this before and feels like it may be strep. He also states that he has had a fever.      Past Medical History:  Diagnosis Date  . Narcolepsy   . Obesity   . Sleep apnea    History reviewed. No pertinent surgical history. History reviewed. No pertinent family history. Social History  Substance Use Topics  . Smoking status: Former Smoker    Packs/day: 0.50    Types: Cigarettes  . Smokeless tobacco: Never Used  . Alcohol use No    Review of Systems  Constitutional: Positive for activity change and fever.  HENT: Positive for sore throat. Negative for congestion, postnasal drip and rhinorrhea.   Eyes: Negative.   Respiratory: Negative.   Gastrointestinal: Negative.   Neurological: Negative.   All other systems reviewed and are negative.   Allergies  Shrimp [shellfish allergy]  Home Medications   Prior to Admission medications   Medication Sig Start Date End Date Taking? Authorizing Provider  amoxicillin (AMOXIL) 500 MG capsule Take 2 capsules (1,000 mg total) by mouth 2 (two) times daily. 10/26/16   Hayden Rasmussenavid Yanet Balliet, NP  cetirizine (ZYRTEC) 10 MG tablet Take 1 tablet (10 mg total) by mouth daily as needed for rhinitis. 06/14/16   Ace GinsSerena Y Sam, PA-C  fluticasone (FLONASE) 50 MCG/ACT nasal spray Place 2 sprays into both nostrils daily as needed (nasal congestion). 06/14/16   Ace GinsSerena Y Sam, PA-C  levofloxacin (LEVAQUIN) 750 MG tablet Take 1 tablet (750 mg total) by mouth daily. 06/18/16   Jeanella CrazeBrandi L Ollis, NP  naproxen (NAPROSYN) 500 MG tablet Take 1 tablet (500 mg total) by  mouth 2 (two) times daily. 06/14/16   Carlene CoriaSerena Y Sam, PA-C   Meds Ordered and Administered this Visit   Medications  ibuprofen (ADVIL,MOTRIN) 100 MG/5ML suspension 800 mg (800 mg Oral Given 10/26/16 1934)  dexamethasone (DECADRON) injection 10 mg (10 mg Intramuscular Given 10/26/16 2003)    BP 160/94 (BP Location: Left Arm)   Pulse 101   Temp 100.5 F (38.1 C) (Oral)   Resp 24   SpO2 98%  No data found.   Physical Exam  Constitutional: He is oriented to person, place, and time. He appears well-developed and well-nourished. No distress.  HENT:  Head: Normocephalic and atraumatic.  Red Oropharynx with large red BV palatine tonsils. Patient has large tongue and in association with the large tonsils difficult to see clearly the full posterior pharynx. Airway is adequately patent though.  Neck: Normal range of motion. Neck supple.  Cardiovascular: Normal rate.   Pulmonary/Chest: Effort normal and breath sounds normal.  Musculoskeletal: Normal range of motion.  Lymphadenopathy:    He has no cervical adenopathy.  Neurological: He is alert and oriented to person, place, and time.  Skin: Skin is warm and dry.  Nursing note and vitals reviewed.   Urgent Care Course   Clinical Course     Procedures (including critical care time)  Labs Review Labs Reviewed  POCT RAPID STREP A - Abnormal; Notable for the following:       Result Value   Streptococcus, Group A  Screen (Direct) POSITIVE (*)    All other components within normal limits    Imaging Review No results found.   Visual Acuity Review  Right Eye Distance:   Left Eye Distance:   Bilateral Distance:    Right Eye Near:   Left Eye Near:    Bilateral Near:         MDM   1. Strep pharyngitis    Drink plenty of cool fluids. May use Cepacol lozenges for sore throat pain. Ibuprofen 600 mg every 6 hours as needed. Sure to take all of your antibiotic as directed. If you develop problems breathing or unable to swallow go  to the emergency department.     Hayden Rasmussenavid Destynie Toomey, NP 10/26/16 2005

## 2016-10-26 NOTE — Discharge Instructions (Signed)
Drink plenty of cool fluids. May use Cepacol lozenges for sore throat pain. Ibuprofen 600 mg every 6 hours as needed. Sure to take all of your antibiotic as directed. If you develop problems breathing or unable to swallow go to the emergency department.

## 2016-10-26 NOTE — ED Triage Notes (Signed)
Here for ST on 2 days... Reports it hurts to talk and to swallow  Also reports fevers  A&O x4... NAD

## 2017-03-29 ENCOUNTER — Ambulatory Visit (HOSPITAL_COMMUNITY)
Admission: EM | Admit: 2017-03-29 | Discharge: 2017-03-29 | Disposition: A | Discharge status: Home / Self Care | Payer: Self-pay | Attending: Internal Medicine | Admitting: Internal Medicine

## 2017-03-29 ENCOUNTER — Encounter (HOSPITAL_COMMUNITY): Payer: Self-pay | Admitting: Family Medicine

## 2017-03-29 DIAGNOSIS — J020 Streptococcal pharyngitis: Secondary | ICD-10-CM

## 2017-03-29 LAB — POCT RAPID STREP A: Streptococcus, Group A Screen (Direct): POSITIVE — AB

## 2017-03-29 MED ORDER — METHYLPREDNISOLONE ACETATE 80 MG/ML IJ SUSP
80.0000 mg | Freq: Once | INTRAMUSCULAR | Status: AC
  Administered 2017-03-29: 80 mg via INTRAMUSCULAR

## 2017-03-29 MED ORDER — PENICILLIN G BENZATHINE 1200000 UNIT/2ML IM SUSP
2.4000 10*6.[IU] | Freq: Once | INTRAMUSCULAR | Status: AC
  Administered 2017-03-29: 2.4 10*6.[IU] via INTRAMUSCULAR

## 2017-03-29 MED ORDER — METHYLPREDNISOLONE ACETATE 80 MG/ML IJ SUSP
INTRAMUSCULAR | Status: AC
  Filled 2017-03-29: qty 1

## 2017-03-29 MED ORDER — PENICILLIN G BENZATHINE 1200000 UNIT/2ML IM SUSP
INTRAMUSCULAR | Status: AC
  Filled 2017-03-29: qty 4

## 2017-03-29 NOTE — ED Triage Notes (Signed)
Pt here for sore throat and swollen tonsils.

## 2017-03-29 NOTE — ED Provider Notes (Signed)
CSN: [id number]     Arrival date & time 03/29/17  1604 History   None    Chief Complaint  Patient presents with  . Sore Throat   (Consider location/radiation/quality/duration/timing/severity/associated sxs/prior Treatment) 36 yo black male with OSA and Narcolepsy presents with sore throat x 2 days. His history is difficult to obtain due to continued sleeping and snoring.       Past Medical History:  Diagnosis Date  . Narcolepsy   . Obesity   . Sleep apnea    History reviewed. No pertinent surgical history. History reviewed. No pertinent family history. Social History  Substance Use Topics  . Smoking status: Former Smoker    Packs/day: 0.50    Types: Cigarettes  . Smokeless tobacco: Never Used  . Alcohol use No    Review of Systems  HENT: Positive for drooling and sore throat.     Allergies  Shrimp [shellfish allergy]  Home Medications   Prior to Admission medications   Not on File   Meds Ordered and Administered this Visit   Medications  methylPREDNISolone acetate (DEPO-MEDROL) injection 80 mg (80 mg Intramuscular Given 03/29/17 1720)  penicillin g benzathine (BICILLIN LA) 1200000 UNIT/2ML injection 2.4 Million Units (2.4 Million Units Intramuscular Given 03/29/17 1738)    BP 122/77   Pulse 92   Temp 99 F (37.2 C)   Resp 14   Wt (!) 305 lb (138.3 kg)   SpO2 95%   BMI 43.76 kg/m  No data found.   Physical Exam  Constitutional: He appears well-developed and well-nourished. No distress.  HENT:  Mouth/Throat: Oropharyngeal exudate present.  +3 tonsils with erythema and exudate  Pulmonary/Chest: Effort normal and breath sounds normal. No stridor.  Lymphadenopathy:    He has no cervical adenopathy.  Neurological: He is alert.  Skin: He is not diaphoretic.  Nursing note and vitals reviewed.   Urgent Care Course     Procedures (including critical care time)  Labs Review Labs Reviewed  POCT RAPID STREP A - Abnormal; Notable for the following:        Result Value   Streptococcus, Group A Screen (Direct) POSITIVE (*)    All other components within normal limits    Imaging Review No results found.   Visual Acuity Review  Right Eye Distance:   Left Eye Distance:   Bilateral Distance:    Right Eye Near:   Left Eye Near:    Bilateral Near:         MDM   1. Strep pharyngitis    + Rapid Strep-Patient was treated with 80 mg DepoMedrol IM and Bicillin LA IM in the setting of strep pharyngitis and swelling. No respiratory distress. Patient was advised to call for a ride following his injections due to severe narcolepsy but he apparently refused and left without formal discharge. This was discussed with attending today.     Riki SheerYoung, Makenzee Choudhry G, [place]-C 03/29/17 1801

## 2017-04-07 ENCOUNTER — Emergency Department (HOSPITAL_COMMUNITY): Payer: No Typology Code available for payment source

## 2017-04-07 ENCOUNTER — Emergency Department (HOSPITAL_COMMUNITY)
Admission: EM | Admit: 2017-04-07 | Discharge: 2017-04-07 | Disposition: A | Discharge status: Home / Self Care | Payer: No Typology Code available for payment source | Attending: Emergency Medicine | Admitting: Emergency Medicine

## 2017-04-07 DIAGNOSIS — Y999 Unspecified external cause status: Secondary | ICD-10-CM | POA: Insufficient documentation

## 2017-04-07 DIAGNOSIS — Y939 Activity, unspecified: Secondary | ICD-10-CM | POA: Insufficient documentation

## 2017-04-07 DIAGNOSIS — M25532 Pain in left wrist: Secondary | ICD-10-CM

## 2017-04-07 DIAGNOSIS — S6992XA Unspecified injury of left wrist, hand and finger(s), initial encounter: Secondary | ICD-10-CM | POA: Diagnosis present

## 2017-04-07 DIAGNOSIS — Y92481 Parking lot as the place of occurrence of the external cause: Secondary | ICD-10-CM | POA: Diagnosis not present

## 2017-04-07 DIAGNOSIS — Z87891 Personal history of nicotine dependence: Secondary | ICD-10-CM | POA: Insufficient documentation

## 2017-04-07 MED ORDER — KETOROLAC TROMETHAMINE 30 MG/ML IJ SOLN
30.0000 mg | Freq: Once | INTRAMUSCULAR | Status: AC
  Administered 2017-04-07: 30 mg via INTRAMUSCULAR
  Filled 2017-04-07: qty 1

## 2017-04-07 MED ORDER — HALOPERIDOL LACTATE 5 MG/ML IJ SOLN
2.5000 mg | Freq: Once | INTRAMUSCULAR | Status: AC
  Administered 2017-04-07: 2.5 mg via INTRAMUSCULAR
  Filled 2017-04-07: qty 1

## 2017-04-07 NOTE — ED Notes (Signed)
Patient pacing room. Advised to stay in bed, due to Hx of narcolepsy. Patient reports he is going to continue to pace. Patient yelling and screaming stating arm burning.
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
## 2017-05-15 ENCOUNTER — Emergency Department (HOSPITAL_COMMUNITY)
Admission: EM | Admit: 2017-05-15 | Discharge: 2017-05-15 | Disposition: A | Discharge status: Home / Self Care | Payer: Medicaid Other | Attending: Emergency Medicine | Admitting: Emergency Medicine

## 2017-05-15 ENCOUNTER — Encounter (HOSPITAL_COMMUNITY): Payer: Self-pay | Admitting: Emergency Medicine

## 2017-05-15 DIAGNOSIS — H5712 Ocular pain, left eye: Secondary | ICD-10-CM | POA: Diagnosis present

## 2017-05-15 DIAGNOSIS — H53132 Sudden visual loss, left eye: Secondary | ICD-10-CM | POA: Insufficient documentation

## 2017-05-15 DIAGNOSIS — Z87891 Personal history of nicotine dependence: Secondary | ICD-10-CM | POA: Diagnosis not present

## 2017-05-15 DIAGNOSIS — H5462 Unqualified visual loss, left eye, normal vision right eye: Secondary | ICD-10-CM

## 2017-05-15 MED ORDER — FLUORESCEIN SODIUM 0.6 MG OP STRP
1.0000 | ORAL_STRIP | Freq: Once | OPHTHALMIC | Status: AC
  Administered 2017-05-15: 1 via OPHTHALMIC
  Filled 2017-05-15: qty 1

## 2017-05-15 MED ORDER — TETRACAINE HCL 0.5 % OP SOLN
2.0000 [drp] | Freq: Once | OPHTHALMIC | Status: AC
  Administered 2017-05-15: 2 [drp] via OPHTHALMIC
  Filled 2017-05-15: qty 4

## 2017-05-15 NOTE — ED Notes (Signed)
Pt given instructions to have wife drive him to opthamologist for follow up care today.  Verbalized understanding and denies any further questions
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
## 2017-05-15 NOTE — ED Provider Notes (Signed)
Complains of left eye pain with diminished vision in left eye for the past 2 weeks. No nausea or vomiting. He states "I see only shadows" nothing makes symptoms better or worse. On exam patient is obese, sleepy arousable to verbal stimulus, left eye sclera is reddened pupil 1-2 mm reactive to light. Visual acuity with left eye he can see only shadows and moving un able to finger count  right eye sclera mildly reddened. Extraocular muscles are intact   Doug SouJacubowitz, Jessica Checketts, MD 05/15/17 [phone]

## 2017-05-15 NOTE — Discharge Instructions (Signed)
[facility] Make sure to arrive before 5pm so that he can see you today. He is expecting your arrival.

## 2017-05-15 NOTE — ED Triage Notes (Addendum)
Pt c/o left eye pain, x 2 weeks. No pruritis, foreign body sensation, gritty sensation, discharge. Pt reports being unable to see anything but shadow from left eye x 2 weeks, left eye normally full vision. Pt unable to see RN from 8 inches away. Left eye pain with EOMs and with consensual pupillary response to light. Left upper eyelid swollen with two edematous masses visible on eyelid eversion.

## 2017-07-23 ENCOUNTER — Encounter (HOSPITAL_COMMUNITY): Payer: Self-pay | Admitting: *Deleted

## 2017-07-23 NOTE — Progress Notes (Signed)
Spoke with pt's wife, Crystal for pre-op call, pt was sleeping. She denies any cardiac history for pt and states he is not diabetic. Pt does have sleep apnea and uses a Cpap.

## 2017-07-24 ENCOUNTER — Ambulatory Visit (HOSPITAL_COMMUNITY)
Admission: RE | Admit: 2017-07-24 | Discharge: 2017-07-24 | Disposition: A | Discharge status: Home / Self Care | Payer: Medicaid Other | Source: Ambulatory Visit | Attending: Oral Surgery | Admitting: Oral Surgery

## 2017-07-24 ENCOUNTER — Encounter (HOSPITAL_COMMUNITY)
Admission: RE | Disposition: A | Discharge status: Home / Self Care | Payer: Self-pay | Source: Ambulatory Visit | Attending: Oral Surgery

## 2017-07-24 ENCOUNTER — Encounter (HOSPITAL_COMMUNITY): Payer: Self-pay | Admitting: Certified Registered"

## 2017-07-24 ENCOUNTER — Ambulatory Visit (HOSPITAL_COMMUNITY): Payer: Medicaid Other | Admitting: Certified Registered"

## 2017-07-24 DIAGNOSIS — Z91013 Allergy to seafood: Secondary | ICD-10-CM | POA: Diagnosis not present

## 2017-07-24 DIAGNOSIS — Z538 Procedure and treatment not carried out for other reasons: Secondary | ICD-10-CM | POA: Diagnosis not present

## 2017-07-24 DIAGNOSIS — K029 Dental caries, unspecified: Secondary | ICD-10-CM | POA: Diagnosis not present

## 2017-07-24 DIAGNOSIS — Z885 Allergy status to narcotic agent status: Secondary | ICD-10-CM | POA: Insufficient documentation

## 2017-07-24 DIAGNOSIS — F1490 Cocaine use, unspecified, uncomplicated: Secondary | ICD-10-CM | POA: Insufficient documentation

## 2017-07-24 DIAGNOSIS — Z6841 Body Mass Index (BMI) 40.0 and over, adult: Secondary | ICD-10-CM | POA: Insufficient documentation

## 2017-07-24 DIAGNOSIS — Z9989 Dependence on other enabling machines and devices: Secondary | ICD-10-CM | POA: Diagnosis not present

## 2017-07-24 DIAGNOSIS — Z9119 Patient's noncompliance with other medical treatment and regimen: Secondary | ICD-10-CM | POA: Diagnosis not present

## 2017-07-24 DIAGNOSIS — G47419 Narcolepsy without cataplexy: Secondary | ICD-10-CM | POA: Insufficient documentation

## 2017-07-24 DIAGNOSIS — G4733 Obstructive sleep apnea (adult) (pediatric): Secondary | ICD-10-CM | POA: Diagnosis not present

## 2017-07-24 DIAGNOSIS — F17200 Nicotine dependence, unspecified, uncomplicated: Secondary | ICD-10-CM | POA: Insufficient documentation

## 2017-07-24 HISTORY — PX: MULTIPLE EXTRACTIONS WITH ALVEOLOPLASTY: SHX5342

## 2017-07-24 LAB — BASIC METABOLIC PANEL
ANION GAP: 5 (ref 5–15)
BUN: 9 mg/dL (ref 6–20)
CO2: 29 mmol/L (ref 22–32)
CREATININE: 1.02 mg/dL (ref 0.61–1.24)
Calcium: 8.8 mg/dL — ABNORMAL LOW (ref 8.9–10.3)
Chloride: 105 mmol/L (ref 101–111)
Glucose, Bld: 108 mg/dL — ABNORMAL HIGH (ref 65–99)
Potassium: 3.7 mmol/L (ref 3.5–5.1)
Sodium: 139 mmol/L (ref 135–145)

## 2017-07-24 LAB — CBC
HEMATOCRIT: 45.9 % (ref 39.0–52.0)
Hemoglobin: 14.9 g/dL (ref 13.0–17.0)
MCH: 29.5 pg (ref 26.0–34.0)
MCHC: 32.5 g/dL (ref 30.0–36.0)
MCV: 90.9 fL (ref 78.0–100.0)
PLATELETS: 289 10*3/uL (ref 150–400)
RBC: 5.05 MIL/uL (ref 4.22–5.81)
RDW: 13.8 % (ref 11.5–15.5)
WBC: 4.7 10*3/uL (ref 4.0–10.5)

## 2017-07-24 SURGERY — MULTIPLE EXTRACTION WITH ALVEOLOPLASTY
Anesthesia: Monitor Anesthesia Care | Site: Mouth

## 2017-07-24 MED ORDER — 0.9 % SODIUM CHLORIDE (POUR BTL) OPTIME
TOPICAL | Status: DC | PRN
Start: 2017-07-24 — End: 2017-07-24
  Administered 2017-07-24: 1000 mL

## 2017-07-24 MED ORDER — OXYMETAZOLINE HCL 0.05 % NA SOLN
NASAL | Status: AC
  Filled 2017-07-24: qty 15

## 2017-07-24 MED ORDER — LACTATED RINGERS IV SOLN
INTRAVENOUS | Status: DC
  Administered 2017-07-24: 10:00:00 via INTRAVENOUS

## 2017-07-24 MED ORDER — PROPOFOL 10 MG/ML IV BOLUS
INTRAVENOUS | Status: AC
  Filled 2017-07-24: qty 40

## 2017-07-24 MED ORDER — MIDAZOLAM HCL 2 MG/2ML IJ SOLN
INTRAMUSCULAR | Status: AC
  Filled 2017-07-24: qty 2

## 2017-07-24 MED ORDER — LIDOCAINE-EPINEPHRINE 2 %-1:100000 IJ SOLN
INTRAMUSCULAR | Status: AC
  Filled 2017-07-24: qty 1

## 2017-07-24 MED ORDER — CEFAZOLIN SODIUM-DEXTROSE 2-4 GM/100ML-% IV SOLN
2.0000 g | INTRAVENOUS | Status: AC
  Administered 2017-07-24: 2 g via INTRAVENOUS

## 2017-07-24 MED ORDER — SODIUM CHLORIDE 0.9 % IR SOLN
Status: DC | PRN
  Administered 2017-07-24: 1000 mL

## 2017-07-24 MED ORDER — LACTATED RINGERS IV SOLN
INTRAVENOUS | Status: DC | PRN
  Administered 2017-07-24: 11:00:00 via INTRAVENOUS

## 2017-07-24 MED ORDER — LIDOCAINE-EPINEPHRINE 2 %-1:100000 IJ SOLN
INTRAMUSCULAR | Status: DC | PRN
  Administered 2017-07-24: 16 mL via INTRADERMAL

## 2017-07-24 MED ORDER — FENTANYL CITRATE (PF) 250 MCG/5ML IJ SOLN
INTRAMUSCULAR | Status: AC
  Filled 2017-07-24: qty 5

## 2017-07-24 SURGICAL SUPPLY — 32 items
BUR CROSS CUT FISSURE 1.6 (BURR) ×2 IMPLANT
BUR CROSS CUT FISSURE 1.6MM (BURR) ×1
BUR EGG ELITE 4.0 (BURR) ×2 IMPLANT
BUR EGG ELITE 4.0MM (BURR) ×1
CANISTER SUCT 3000ML PPV (MISCELLANEOUS) ×3 IMPLANT
COVER SURGICAL LIGHT HANDLE (MISCELLANEOUS) ×3 IMPLANT
CRADLE DONUT ADULT HEAD (MISCELLANEOUS) ×3 IMPLANT
DECANTER SPIKE VIAL GLASS SM (MISCELLANEOUS) ×2 IMPLANT
DRAPE U-SHAPE 76X120 STRL (DRAPES) ×3 IMPLANT
FLUID NSS /IRRIG 1000 ML XXX (MISCELLANEOUS) ×3 IMPLANT
GAUZE PACKING FOLDED 2  STR (GAUZE/BANDAGES/DRESSINGS) ×2
GAUZE PACKING FOLDED 2 STR (GAUZE/BANDAGES/DRESSINGS) ×1 IMPLANT
GLOVE BIO SURGEON STRL SZ 6.5 (GLOVE) ×2 IMPLANT
GLOVE BIO SURGEON STRL SZ7.5 (GLOVE) ×3 IMPLANT
GLOVE BIO SURGEONS STRL SZ 6.5 (GLOVE) ×1
GLOVE BIOGEL PI IND STRL 7.0 (GLOVE) IMPLANT
GLOVE BIOGEL PI INDICATOR 7.0 (GLOVE) ×2
GOWN STRL REUS W/ TWL LRG LVL3 (GOWN DISPOSABLE) ×1 IMPLANT
GOWN STRL REUS W/ TWL XL LVL3 (GOWN DISPOSABLE) ×1 IMPLANT
GOWN STRL REUS W/TWL LRG LVL3 (GOWN DISPOSABLE) ×3
GOWN STRL REUS W/TWL XL LVL3 (GOWN DISPOSABLE) ×3
KIT BASIN OR (CUSTOM PROCEDURE TRAY) ×3 IMPLANT
KIT ROOM TURNOVER OR (KITS) ×3 IMPLANT
NEEDLE 22X1 1/2 (OR ONLY) (NEEDLE) ×6 IMPLANT
NEEDLE 27GAX1X1/2 (NEEDLE) IMPLANT
NS IRRIG 1000ML POUR BTL (IV SOLUTION) ×3 IMPLANT
PAD ARMBOARD 7.5X6 YLW CONV (MISCELLANEOUS) ×3 IMPLANT
SUT CHROMIC 3 0 PS 2 (SUTURE) ×3 IMPLANT
SYR CONTROL 10ML LL (SYRINGE) ×3 IMPLANT
TRAY ENT MC OR (CUSTOM PROCEDURE TRAY) ×3 IMPLANT
TUBING IRRIGATION (MISCELLANEOUS) ×3 IMPLANT
YANKAUER SUCT BULB TIP NO VENT (SUCTIONS) ×3 IMPLANT

## 2017-07-24 NOTE — Op Note (Signed)
07/24/2017  11:07 AM  PATIENT:  Darryl Moody  36 y.o. male  PRE-OPERATIVE DIAGNOSIS:  Multiple nonrestorable teeth # 1, 3, 8, 9, 14, 16, 17, 18, 31, 32  POST-OPERATIVE DIAGNOSIS:  SAME  PROCEDURE:  Procedure(s): attempted MULTIPLE EXTRACTION WITH ALVEOLOPLASTY  SURGEON:  Surgeon(s): Ocie DoyneJensen, Dawnetta Copenhaver, DDS  ANESTHESIA:   local   EBL:  minimal  DRAINS: none   SPECIMEN:  No Specimen  COUNTS:  YES  PLAN OF CARE: Discharge to home after PACU  PATIENT DISPOSITION:  PACU - hemodynamically stable.   PROCEDURE DETAILS: Dictation #[id number]  Georgia LopesScott M. Leylani Duley, DMD 07/24/2017 11:07 AM

## 2017-07-24 NOTE — Anesthesia Preprocedure Evaluation (Signed)
Anesthesia Evaluation  Patient identified by MRN, date of birth, ID bandGeneral Assessment Comment:Somnolent, obstructing, difficult to arouse   Reviewed: Allergy & Precautions, NPO status , Patient's Chart, lab work & pertinent test results  History of Anesthesia Complications Negative for: history of anesthetic complications  Airway Mallampati: IV  TM Distance: >3 FB Neck ROM: Full    Dental  (+) Poor Dentition   Pulmonary sleep apnea , Current Smoker,  Severe OSA AHI 75, narcolepsy, BiPAP non compliant   breath sounds clear to auscultation       Cardiovascular  Rhythm:Regular     Neuro/Psych    GI/Hepatic (+)     substance abuse  cocaine use,   Endo/Other  Morbid obesity  Renal/GU      Musculoskeletal   Abdominal   Peds  Hematology   Anesthesia Other Findings   Reproductive/Obstetrics                             Anesthesia Physical Anesthesia Plan  ASA: IV  Anesthesia Plan: MAC   Post-op Pain Management:    Induction:   PONV Risk Score and Plan:   Airway Management Planned: Nasal Cannula  Additional Equipment: None  Intra-op Plan:   Post-operative Plan:   Informed Consent: I have reviewed the patients History and Physical, chart, labs and discussed the procedure including the risks, benefits and alternatives for the proposed anesthesia with the patient or authorized representative who has indicated his/her understanding and acceptance.   Dental advisory given  Plan Discussed with: CRNA and Surgeon  Anesthesia Plan Comments: (No sedation due to OSA and current state of arousal, will monitor while case done under local and abort once no longer tolerated under local only )        Anesthesia Quick Evaluation

## 2017-07-24 NOTE — Transfer of Care (Signed)
Immediate Anesthesia Transfer of Care Note  Patient: Darryl Moody  Procedure(s) Performed: Procedure(s): attempted MULTIPLE EXTRACTION WITH ALVEOLOPLASTY (N/A)  Patient Location: PACU  Anesthesia Type:local anesthetic  Level of Consciousness: awake and oriented  Airway & Oxygen Therapy: Patient Spontanous Breathing and Patient connected to nasal cannula oxygen  Post-op Assessment: Report given to RN, Post -op Vital signs reviewed and stable and Patient moving all extremities X 4  Post vital signs: Reviewed and stable  Last Vitals:  Vitals:   07/24/17 0923 07/24/17 1113  BP: (!) 139/96 (!) 148/84  Pulse: 83 94  Resp: 18 (!) 24  Temp: 36.7 C 36.7 C  SpO2: 100% 100%    Last Pain:  Vitals:   07/24/17 1113  TempSrc:   PainSc: 0-No pain      Patients Stated Pain Goal: 3 (07/24/17 0929)  Complications: No apparent anesthesia complications

## 2017-07-24 NOTE — H&P (Signed)
HISTORY AND PHYSICAL  Darryl Moody is a 36 y.o. male patient with CC: painful teeth  No diagnosis found.  Past Medical History:  Diagnosis Date  . Narcolepsy   . Obesity   . Sleep apnea    does use cpap    Current Facility-Administered Medications  Medication Dose Route Frequency Provider Last Rate Last Dose  . ceFAZolin (ANCEF) IVPB 2g/100 mL premix  2 g Intravenous On Call to OR Diona Browner, DDS      . lactated ringers infusion   Intravenous Continuous Oleta Mouse, MD 10 mL/hr at 07/24/17 0940     Allergies  Allergen Reactions  . Hydrocodone-Acetaminophen Anaphylaxis  . Shrimp [Shellfish Allergy] Swelling and Other (See Comments)    Throat swells   Active Problems:   * No active hospital problems. *  Vitals: Blood pressure (!) 139/96, pulse 83, temperature 98.1 F (36.7 C), temperature source Oral, resp. rate 18, height 5' 10"  (1.778 m), weight (!) 325 lb (147.4 kg), SpO2 100 %. Lab results: Results for orders placed or performed during the hospital encounter of 07/24/17 (from the past 24 hour(s))  CBC     Status: None   Collection Time: 07/24/17  9:33 AM  Result Value Ref Range   WBC 4.7 4.0 - 10.5 K/uL   RBC 5.05 4.22 - 5.81 MIL/uL   Hemoglobin 14.9 13.0 - 17.0 g/dL   HCT 45.9 39.0 - 52.0 %   MCV 90.9 78.0 - 100.0 fL   MCH 29.5 26.0 - 34.0 pg   MCHC 32.5 30.0 - 36.0 g/dL   RDW 13.8 11.5 - 15.5 %   Platelets 289 150 - 400 K/uL  Basic metabolic panel     Status: Abnormal   Collection Time: 07/24/17  9:33 AM  Result Value Ref Range   Sodium 139 135 - 145 mmol/L   Potassium 3.7 3.5 - 5.1 mmol/L   Chloride 105 101 - 111 mmol/L   CO2 29 22 - 32 mmol/L   Glucose, Bld 108 (H) 65 - 99 mg/dL   BUN 9 6 - 20 mg/dL   Creatinine, Ser 1.02 0.61 - 1.24 mg/dL   Calcium 8.8 (L) 8.9 - 10.3 mg/dL   GFR calc non Af Amer >60 >60 mL/min   GFR calc Af Amer >60 >60 mL/min   Anion gap 5 5 - 15   Radiology Results: No results found. General appearance: morbidly obese  and intermittantly asleep, has to be stimulated to wake and respond Head: Normocephalic, without obvious abnormality, atraumatic Eyes: negative Nose: Nares normal. Septum midline. Mucosa normal. No drainage or sinus tenderness. Throat: multiple carious teeth. Pharynx clear, no trismus Neck: no adenopathy, supple, symmetrical, trachea midline and thyroid not enlarged, symmetric, no tenderness/mass/nodules Resp: clear to auscultation bilaterally Cardio: regular rate and rhythm, S1, S2 normal, no murmur, click, rub or gallop  Assessment: 36 yo Severe narcolepsy, OSA, morbid obesity, airway obstruction while awake with multiple nonrestorable teeth.  Plan: Multiple dental extractions. MAC. Day surgery.   Darryl Moody 07/24/2017

## 2017-07-24 NOTE — Anesthesia Postprocedure Evaluation (Signed)
Anesthesia Post Note  Patient: Darryl Moody  Procedure(s) Performed: Procedure(s) (LRB): attempted MULTIPLE EXTRACTION WITH ALVEOLOPLASTY (N/A)     Patient location during evaluation: PACU Anesthesia Type: MAC Level of consciousness: awake and alert Pain management: pain level controlled Vital Signs Assessment: post-procedure vital signs reviewed and stable Respiratory status: spontaneous breathing, nonlabored ventilation, respiratory function stable and patient connected to nasal cannula oxygen Cardiovascular status: stable and blood pressure returned to baseline Postop Assessment: no apparent nausea or vomiting Anesthetic complications: no    Last Vitals:  Vitals:   07/24/17 0923 07/24/17 1113  BP: (!) 139/96 (!) 148/84  Pulse: 83 94  Resp: 18 (!) 24  Temp: 36.7 C 36.7 C  SpO2: 100% 100%    Last Pain:  Vitals:   07/24/17 1113  TempSrc:   PainSc: 0-No pain                 Pearle Wandler S

## 2017-07-25 ENCOUNTER — Encounter (HOSPITAL_COMMUNITY): Payer: Self-pay | Admitting: Oral Surgery

## 2017-07-25 NOTE — Op Note (Signed)
NAME:  Darryl Moody, Darryl Moody                   ACCOUNT NO.:  MEDICAL RECORD NO.:  [id number]  LOCATION:                                 FACILITY:  PHYSICIAN:  Georgia Lopes, M.D.  DATE OF BIRTH:  July 28, 1981  DATE OF PROCEDURE:  07/24/2017 DATE OF DISCHARGE:                              OPERATIVE REPORT   PREOPERATIVE DIAGNOSES: 1. Multiple nonrestorable teeth numbers 1, 3, 8, 9, 14, 16, 17, 18,     31, 32. 2. Morbid obesity. 3. Narcolepsy. 4. Obstructive sleep apnea.  POSTOPERATIVE DIAGNOSES: 1. Multiple nonrestorable teeth numbers 1, 3, 8, 9, 14, 16, 17, 18,     31, 32. 2. Morbid obesity. 3. Narcolepsy. 4. Obstructive sleep apnea. Postop diagnosis same.  PROCEDURE:  Attempted multiple extractions with alveoplasty.  SURGEON:  Georgia Lopes, M.D.  ANESTHESIA:  Rodman Comp, attending.  DESCRIPTION OF PROCEDURE:  The patient was taken to the operating room and placed in a semi-reclined position.  The patient was draped for the procedure.  Using local anesthesia, inferior alveolar blocks were provided on the left side and buccal and palatal infiltration around the teeth numbers 9, 14, and 16.  The patient was unable to tolerate local anesthesia adequately.  Attempt was made to extract #14 after suitable time had been allowed for anesthesia, and the patient was unable to tolerate surgery.  The surgery was therefore canceled as the patient could not tolerate general anesthesia or sedation for fear of apnea and need for intubation which could possibly lead to prolonged intubation. It was determined that the patient should be scheduled to see an ear, nose, and throat or pulmonologist for further workup for breathing difficulty and in preparation for any oral surgery.  ESTIMATED BLOOD LOSS:  Zero.  SPECIMENS:  None.  DRAINS:  None.  COUNTS:  Correct.     Georgia Lopes, M.D.     SMJ/MEDQ  D:  07/24/2017  T:  07/25/2017  Job:  [id number]

## 2017-07-30 ENCOUNTER — Ambulatory Visit: Payer: Self-pay | Admitting: Neurology

## 2017-07-30 ENCOUNTER — Telehealth: Payer: Self-pay | Admitting: Pulmonary Disease

## 2017-07-30 NOTE — Telephone Encounter (Signed)
ATC patient but his VM was full.   Sleep study has been printed out and placed up front for pickup.

## 2017-07-31 NOTE — Telephone Encounter (Signed)
Attempted to call the pt but the VM is full.  X 2

## 2017-07-31 NOTE — Telephone Encounter (Signed)
Pt returning call and can be reached @ [phone].Caren Griffins

## 2017-07-31 NOTE — Telephone Encounter (Signed)
Attempted to contact pt. No answer and his voicemail is full. Will try back.

## 2017-08-01 NOTE — Telephone Encounter (Signed)
Called and spoke with pt and he is aware of copy of the sleep study that has been left up front and he is aware. He will come by and pick this up.    Pt has been scheduled to see RA on 10/1

## 2017-08-11 ENCOUNTER — Ambulatory Visit: Payer: Medicaid Other | Admitting: Pulmonary Disease

## 2017-08-12 ENCOUNTER — Ambulatory Visit: Payer: Self-pay | Admitting: Neurology

## 2017-09-15 IMAGING — CR DG CHEST 1V PORT
1 series · 1 of 1 positions shown · non-contrast
Comparison: 07/27/2015

CLINICAL DATA: Acute on chronic respiratory failure. ET tube
placement.

EXAM:
PORTABLE CHEST - 1 VIEW

[AP]
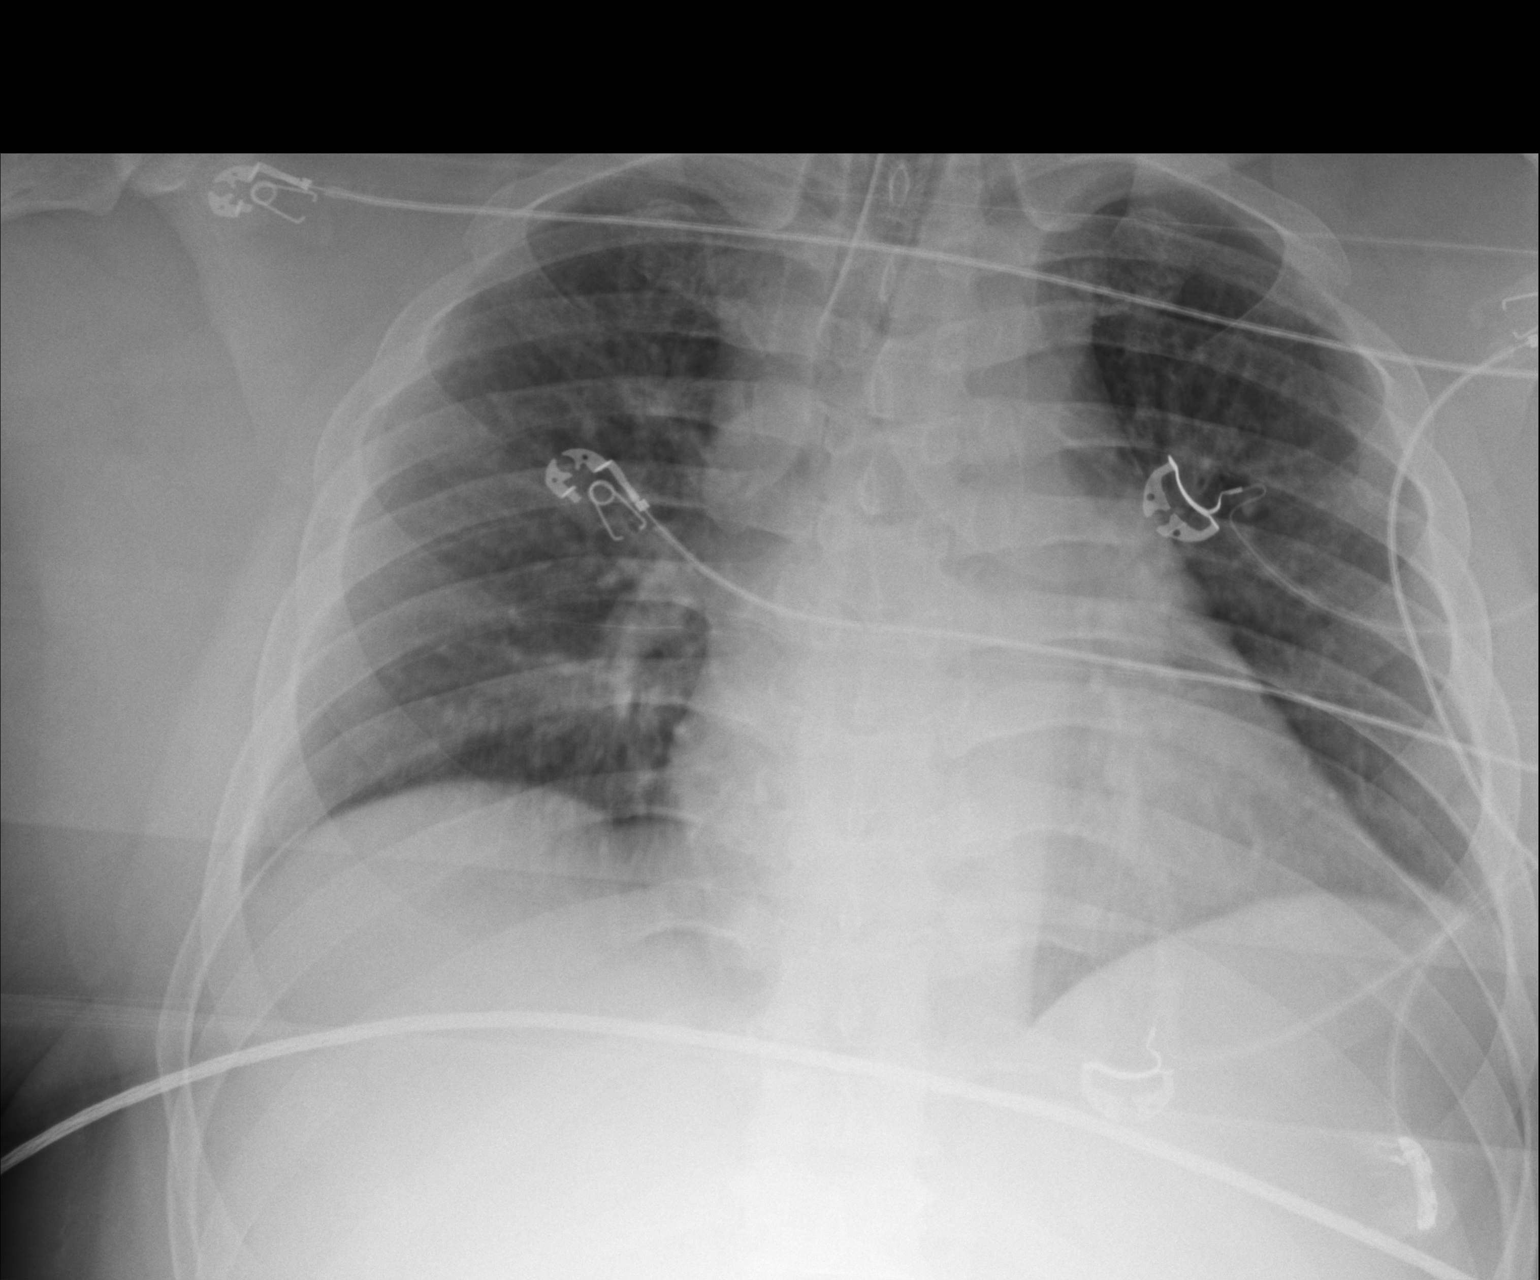

[1 of 1 positions shown; findings below may reference images not displayed]

FINDINGS: Endotracheal tube is 4 cm above the carina. Mild cardiomegaly. No
confluent airspace opacities or effusions. No acute bony
abnormality.
IMPRESSION: Interval intubation with endotracheal tube 4 cm above the carina.
Mild cardiomegaly.

## 2017-09-15 IMAGING — DX DG CHEST 1V PORT
1 series · 1 of 1 positions shown · non-contrast
Comparison: Chest radiograph 04/03/2015

CLINICAL DATA: Patient with apnea.  Narcolepsy.

EXAM:
PORTABLE CHEST - 1 VIEW

[chest ap]
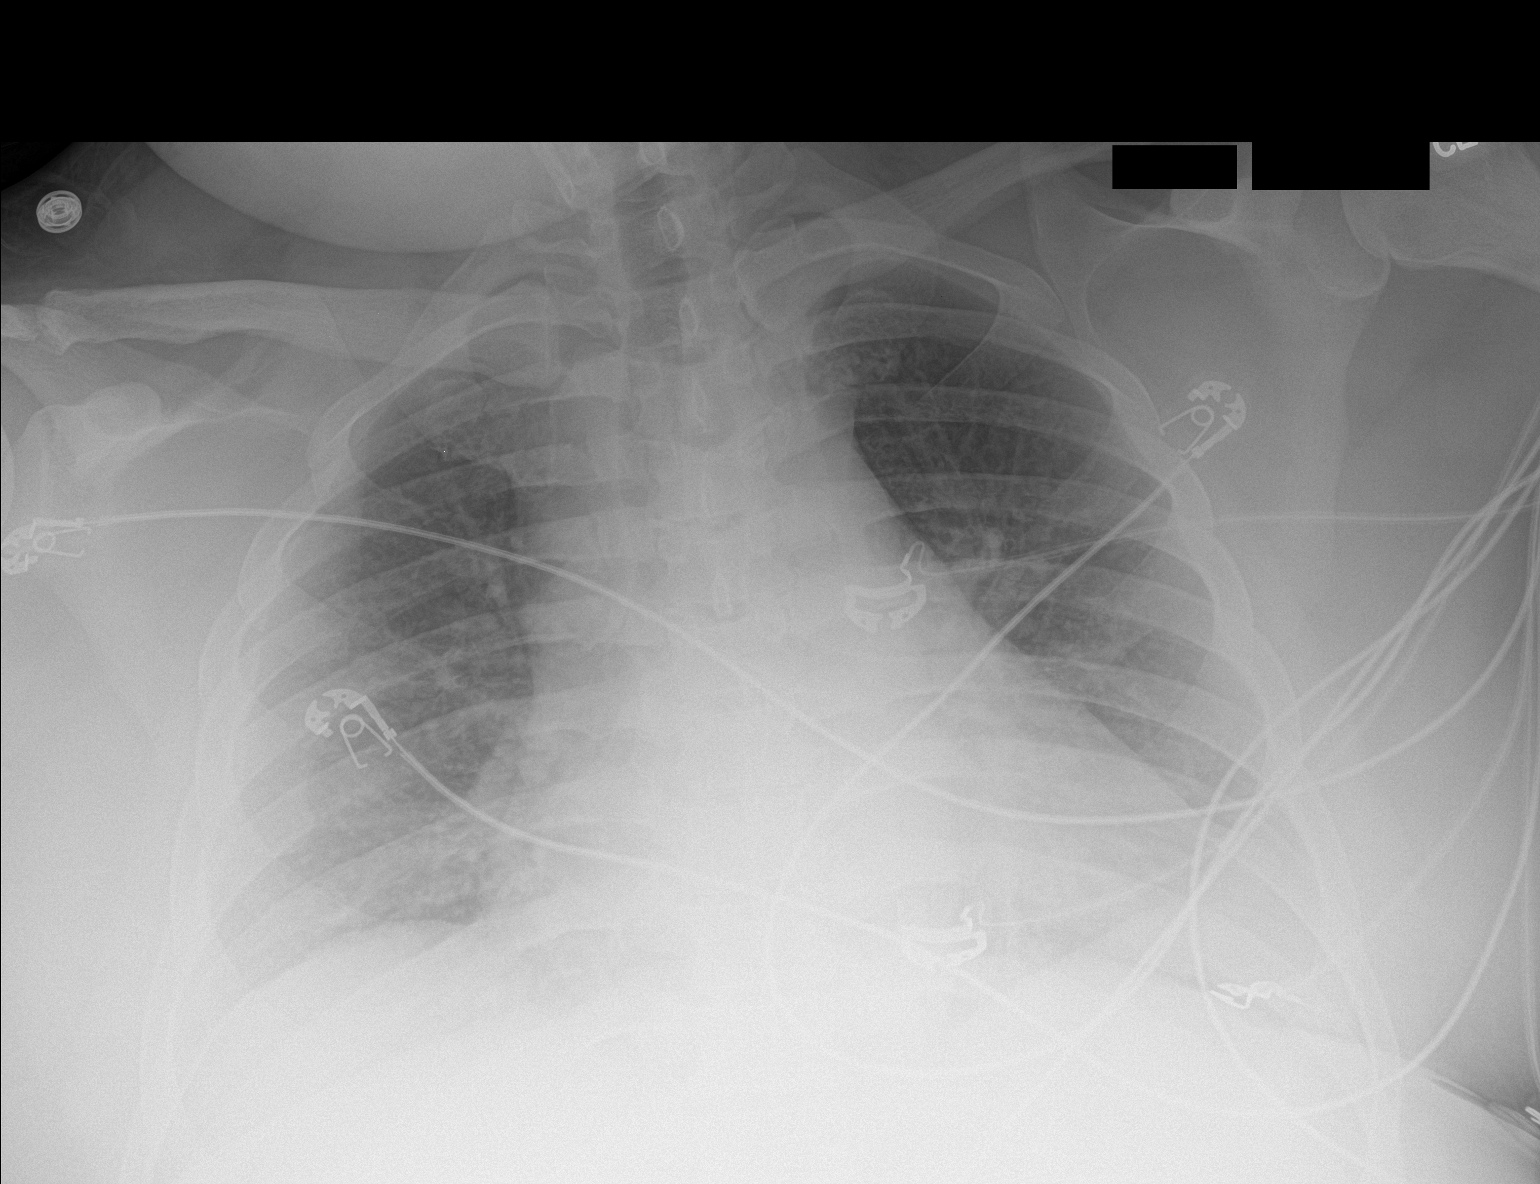

[1 of 1 positions shown; findings below may reference images not displayed]

FINDINGS: Monitoring leads overlie the patient. Low lung volumes. Stable
enlarged cardiac and mediastinal contours. No consolidative
pulmonary opacities. No pleural effusion or pneumothorax.
IMPRESSION: Low lung volumes.  Cardiomegaly.  No acute cardiopulmonary process.

## 2017-09-15 IMAGING — CR DG ABDOMEN 1V
1 series · 1 of 1 positions shown · non-contrast
Comparison: None.

CLINICAL DATA: Enteric tube placement.

EXAM:
ABDOMEN - 1 VIEW

[AP]
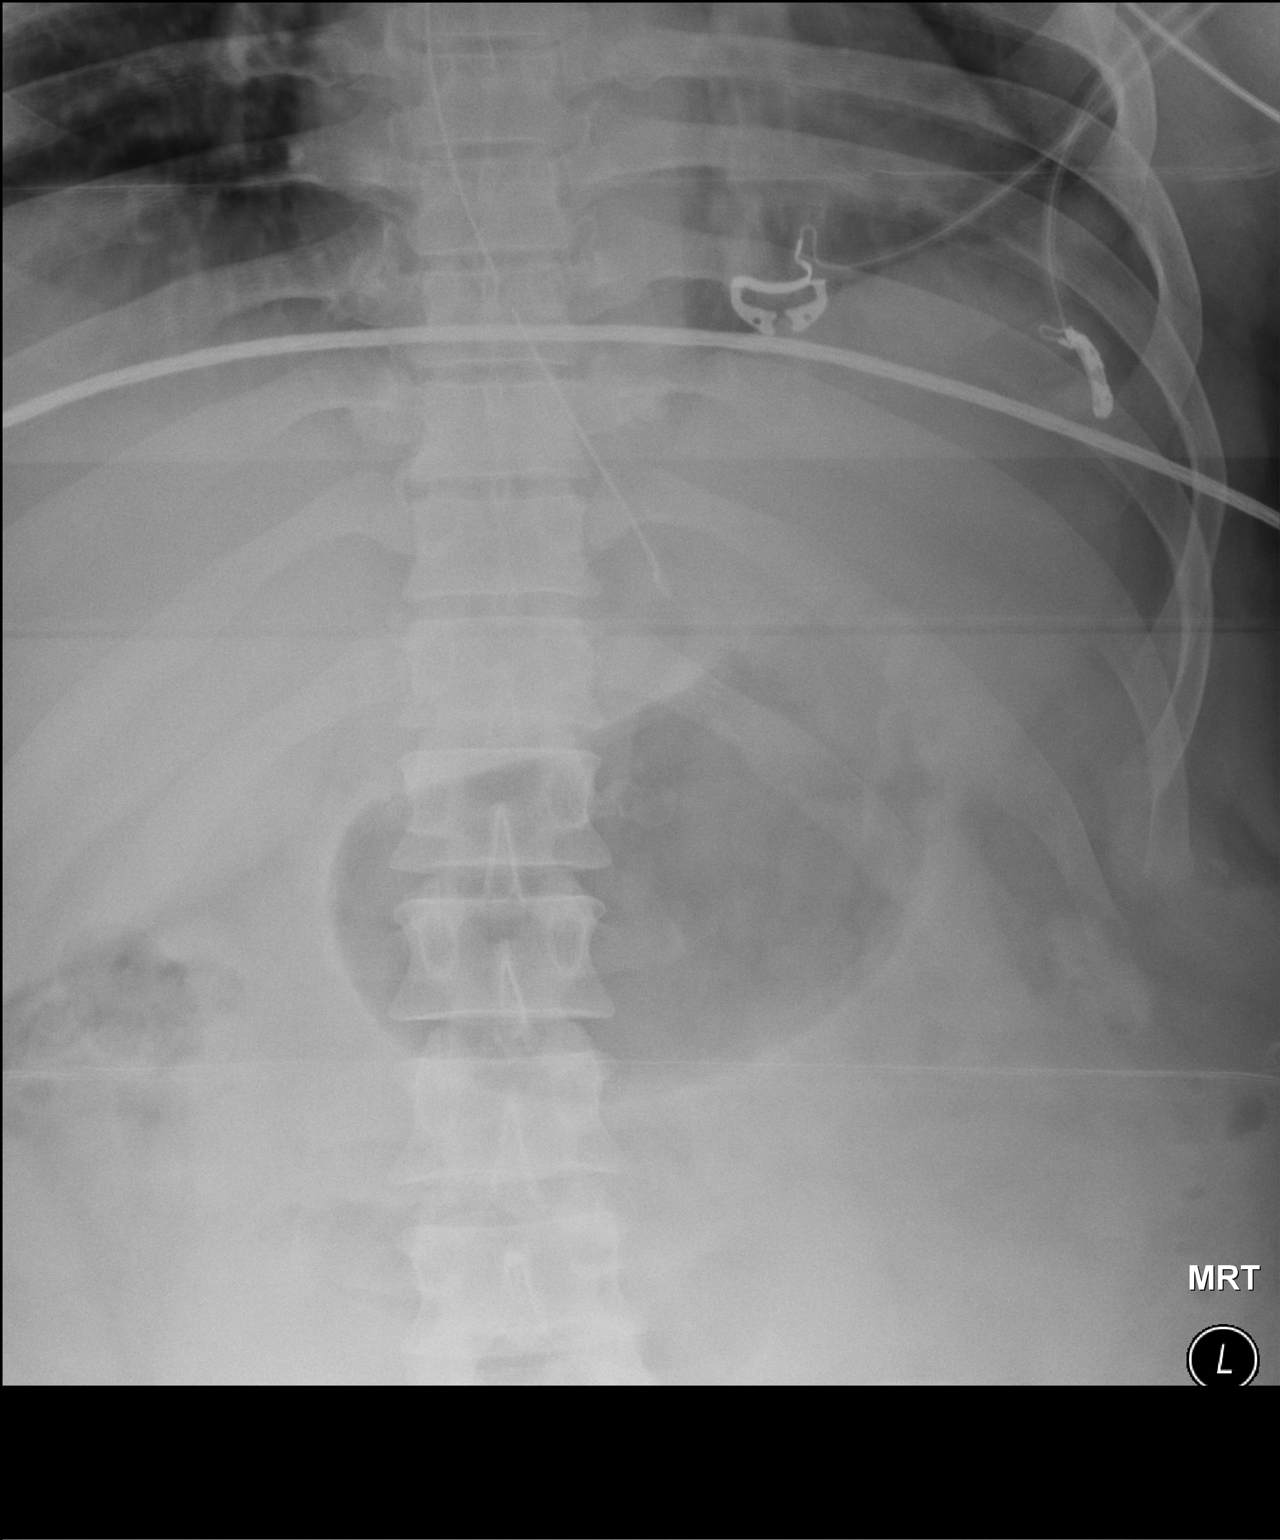

[1 of 1 positions shown; findings below may reference images not displayed]

FINDINGS: Enteric tube is present with tip over the stomach in the left upper
quadrant just below level of the gastroesophageal junction the side
port is above the gastroesophageal junction over the distal
esophagus. This could be advanced approximately 12 cm.

Bowel gas pattern is nonobstructive. Bones soft tissues are within
normal.
IMPRESSION: Enteric tube with tip just below the gastroesophageal junction in
the left upper quadrant. This could be advanced approximately 12 cm.

## 2017-09-16 IMAGING — CR DG CHEST 1V PORT
1 series · 1 of 1 positions shown · non-contrast
Comparison: None.

CLINICAL DATA: Respiratory failure.

EXAM:
PORTABLE CHEST - 1 VIEW

[AP]
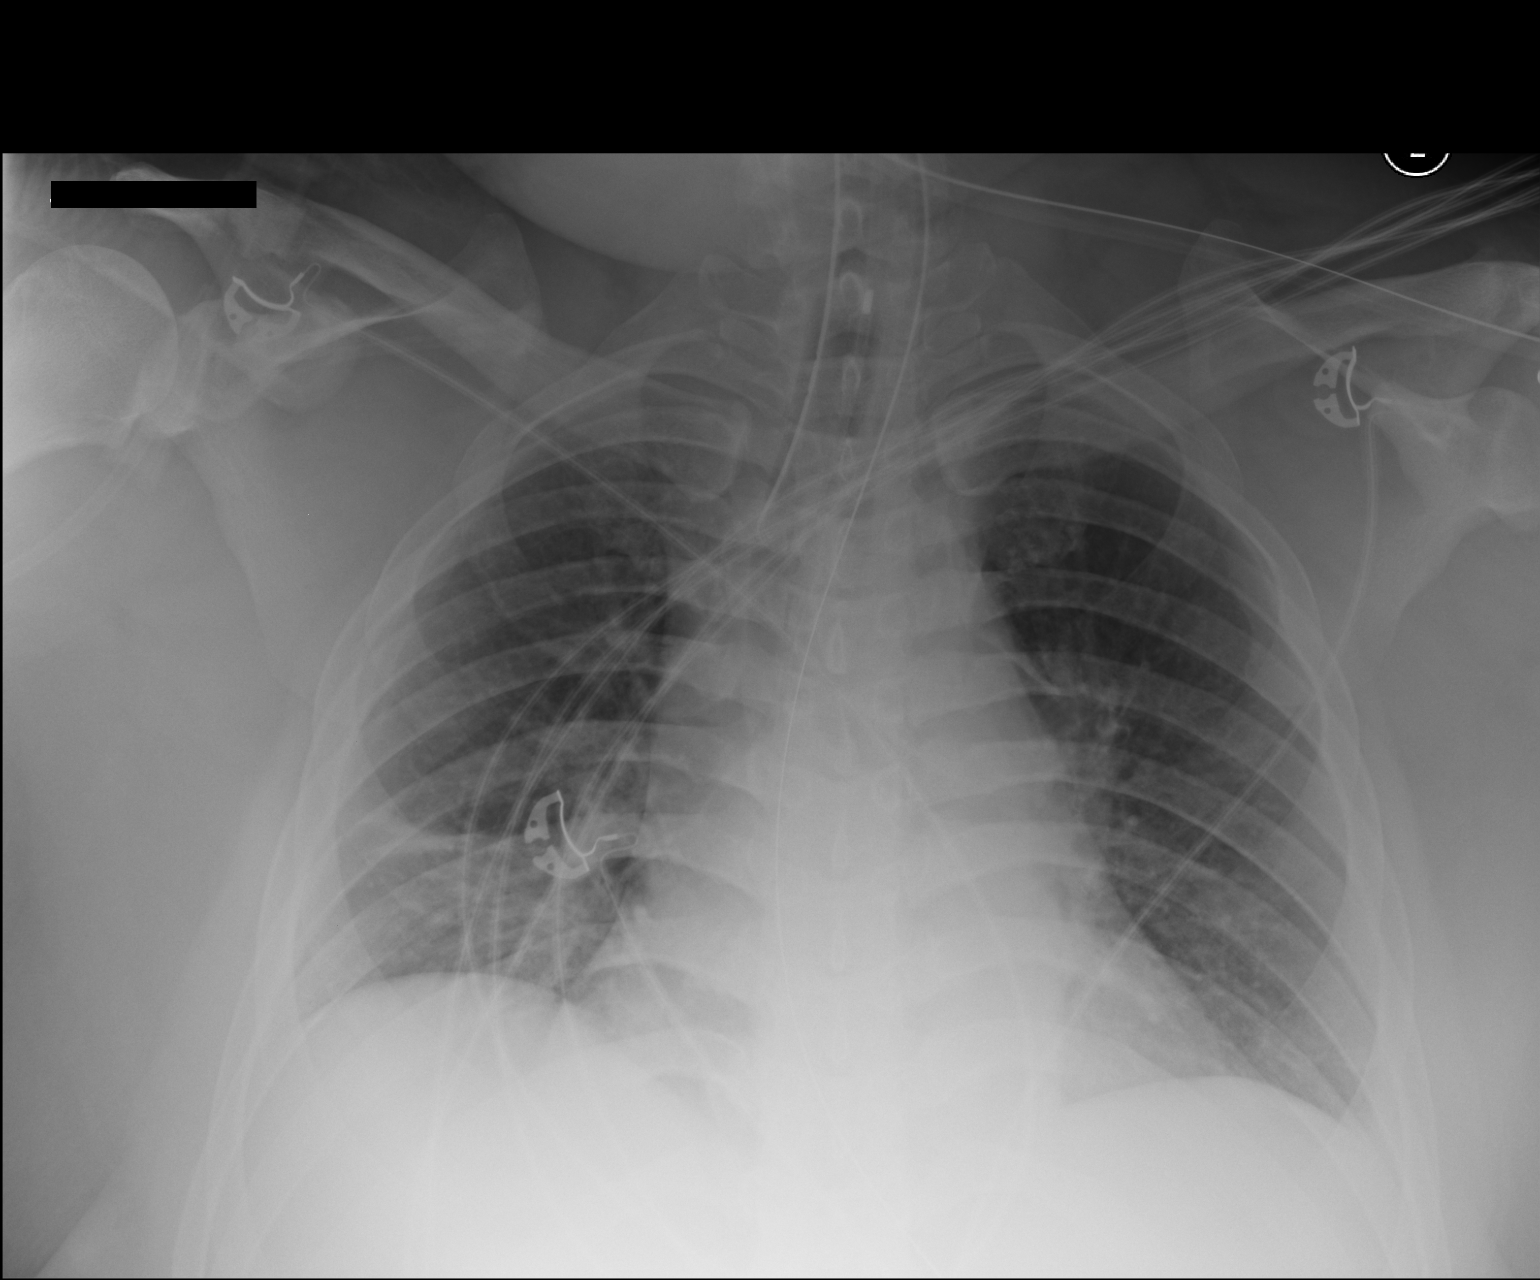

[1 of 1 positions shown; findings below may reference images not displayed]

FINDINGS: Interim placement of NG tube, its tip is below the left
hemidiaphragm. Endotracheal tube in stable position. Cardiomegaly.
Right lower lobe atelectasis and/or infiltrate. No pleural effusion
or pneumothorax.
IMPRESSION: 1. Endotracheal tube and NG tube in good anatomic position.
2. Right lower lobe atelectasis and/or infiltrate.

## 2017-09-23 ENCOUNTER — Ambulatory Visit: Payer: Medicaid Other | Admitting: Pulmonary Disease

## 2017-10-26 ENCOUNTER — Emergency Department (HOSPITAL_COMMUNITY)
Admission: EM | Admit: 2017-10-26 | Discharge: 2017-10-26 | Disposition: A | Discharge status: Home / Self Care | Payer: Medicaid Other | Attending: Emergency Medicine | Admitting: Emergency Medicine

## 2017-10-26 ENCOUNTER — Encounter (HOSPITAL_COMMUNITY): Payer: Self-pay | Admitting: Emergency Medicine

## 2017-10-26 DIAGNOSIS — F17210 Nicotine dependence, cigarettes, uncomplicated: Secondary | ICD-10-CM | POA: Diagnosis not present

## 2017-10-26 DIAGNOSIS — M545 Low back pain: Secondary | ICD-10-CM | POA: Insufficient documentation

## 2017-10-26 DIAGNOSIS — M549 Dorsalgia, unspecified: Secondary | ICD-10-CM

## 2017-10-26 MED ORDER — PREDNISONE 50 MG PO TABS: ORAL_TABLET | ORAL | 0 refills | Status: DC

## 2017-10-26 MED ORDER — KETOROLAC TROMETHAMINE 60 MG/2ML IM SOLN
30.0000 mg | Freq: Once | INTRAMUSCULAR | Status: AC
  Administered 2017-10-26: 30 mg via INTRAMUSCULAR
  Filled 2017-10-26: qty 2

## 2017-10-26 NOTE — ED Notes (Signed)
Bed: WA09 Expected date:  Expected time:  Means of arrival:  Comments: EMS 36 yo male lower back pain x 2 days-no trauma-has narcolepsy

## 2017-10-26 NOTE — ED Provider Notes (Signed)
[facility]-EMERGENCY DEPT Provider Note   CSN: [id number] Arrival date & time: 10/26/17  0154     History   Chief Complaint Chief Complaint  Patient presents with  . Back Pain     HPI  Blood pressure (!) 168/107, pulse 97, temperature 97.8 F (36.6 C), temperature source Oral, resp. rate 20, SpO2 97 %.  Darryl AusCorry E Ainley is a 36 y.o. male with past medical history significant for narcolepsy complaining of left low back pain onset 2 days ago, states that he slept on it wrong.  He denies any trauma, incontinence, fever, chills, history of cancer, history of IV drug use.  He states that the pain is exacerbated by position.  Past Medical History:  Diagnosis Date  . Narcolepsy   . Obesity   . Sleep apnea    does use cpap    Patient Active Problem List   Diagnosis Date Noted  . Narcolepsy 08/10/2015  . Acute encephalopathy 07/27/2015  . Respiratory failure (HCC) 07/27/2015  . Acute on chronic respiratory failure (HCC)   . Obstructive sleep apnea     Past Surgical History:  Procedure Laterality Date  . MULTIPLE EXTRACTIONS WITH ALVEOLOPLASTY N/A 07/24/2017   Procedure: attempted MULTIPLE EXTRACTION WITH ALVEOLOPLASTY;  Surgeon: Ocie DoyneJensen, Scott, DDS;  Location: [facility] OR;  Service: Oral Surgery;  Laterality: N/A;  . NO PAST SURGERIES         Home Medications    Prior to Admission medications   Medication Sig Start Date End Date Taking? Authorizing Provider  predniSONE (DELTASONE) 50 MG tablet Take 1 tablet daily with breakfast 10/26/17   Jahmeer Porche, Joni ReiningNicole, PA-C    Family History No family history on file.  Social History Social History   Tobacco Use  . Smoking status: Current Some Day Smoker    Packs/day: 0.50    Types: Cigarettes  . Smokeless tobacco: Never Used  Substance Use Topics  . Alcohol use: No    Alcohol/week: 0.0 oz  . Drug use: Yes    Types: Cocaine    Comment: wife states no longer using cocaine for a couple of months as of  07/23/17     Allergies   Hydrocodone-acetaminophen and Shrimp [shellfish allergy]   Review of Systems Review of Systems  A complete review of systems was obtained and all systems are negative except as noted in the HPI and PMH.   Physical Exam Updated Vital Signs BP (!) 168/107   Pulse 97   Temp 97.8 F (36.6 C) (Oral)   Resp 20   SpO2 97%   Physical Exam  Constitutional: He is oriented to person, place, and time. He appears well-developed and well-nourished. No distress.  Patient somnolent and snoring but responsive to voice after much encouragement  HENT:  Head: Normocephalic and atraumatic.  Mouth/Throat: Oropharynx is clear and moist.  Eyes: Conjunctivae and EOM are normal. Pupils are equal, round, and reactive to light.  Neck: Normal range of motion.  Cardiovascular: Normal rate, regular rhythm and intact distal pulses.  Pulmonary/Chest: Effort normal and breath sounds normal.  Abdominal: Soft. There is no tenderness.  Musculoskeletal: Normal range of motion.  Neurological: He is alert and oriented to person, place, and time.  No point tenderness to percussion of lumbar spinal processes.  No TTP or paraspinal muscular spasm. Strength is 5 out of 5 to bilateral lower extremities at hip and knee; extensor hallucis longus 5 out of 5. Ankle strength 5 out of 5, no clonus, neurovascularly intact. No  saddle anaesthesia. Patellar reflexes are 2+ bilaterally.     Skin: He is not diaphoretic.  Psychiatric: He has a normal mood and affect.  Nursing note and vitals reviewed.    ED Treatments / Results  Labs (all labs ordered are listed, but only abnormal results are displayed) Labs Reviewed - No data to display  EKG  EKG Interpretation None       Radiology No results found.  Procedures Procedures (including critical care time)  Medications Ordered in ED Medications  ketorolac (TORADOL) injection 30 mg (30 mg Intramuscular Given 10/26/17 0341)     Initial  Impression / Assessment and Plan / ED Course  I have reviewed the triage vital signs and the nursing notes.  Pertinent labs & imaging results that were available during my care of the patient were reviewed by me and considered in my medical decision making (see chart for details).     Vitals:   10/26/17 0200 10/26/17 0217 10/26/17 0348  BP:  (!) 150/99 (!) 168/107  Pulse:  68 97  Resp:  (!) 24 20  Temp:   97.8 F (36.6 C)  TempSrc:   Oral  SpO2: 95% 94% 97%    Medications  ketorolac (TORADOL) injection 30 mg (30 mg Intramuscular Given 10/26/17 0341)    Darryl Moody is 36 y.o. male presenting with  back pain.  No neurological deficits and normal neuro exam.  Patient can walk but states is painful.  No loss of bowel or bladder control.  No concern for cauda equina.  No fever, night sweats, weight loss, h/o cancer, IVDU.  RICE protocol and pain medicine indicated and discussed with patient.  Evaluation does not show pathology that would require ongoing emergent intervention or inpatient treatment. Pt is hemodynamically stable and mentating appropriately. Discussed findings and plan with patient/guardian, who agrees with care plan. All questions answered. Return precautions discussed and outpatient follow up given.    Final Clinical Impressions(s) / ED Diagnoses   Final diagnoses:  Musculoskeletal back pain    ED Discharge Orders        Ordered    predniSONE (DELTASONE) 50 MG tablet     10/26/17 0325       Tijana Walder, Joni Reiningicole, PA-C 10/26/17 [id number]    Geoffery Lyonselo, Douglas, MD 10/26/17 [phone]

## 2017-10-26 NOTE — ED Triage Notes (Signed)
Pt comes to ed, via ems, c/o sharp lower back pain. starting yesterday.  Denies fall of injury. Pt has a history narcolepsy, pt on cpap at home. Pt falls asleep often. Pt took pain meds tonight but could not verbalize what or amount. V/s on arrival bp140/80, hr 110, rr 16, spo2 95 room air. 12 lead sinus with pvc. [address], [address], [place] [id number].mom on the way. Denies ETOH

## 2017-10-26 NOTE — Discharge Instructions (Signed)
For pain control please take ibuprofen (also known as Motrin or Advil) 800mg  (this is normally 4 over the counter pills) 3 times a day  for 5 days. Take with food to minimize stomach irritation.   Take acetaminophen (Tylenol) up to 975 mg (this is normally 3 over-the-counter pills) up to 3 times a day. Do not drink alcohol. Make sure your other medications do not contain acetaminophen (Read the labels!)  Please follow with your primary care doctor in the next 2 days for a check-up. They must obtain records for further management.   Do not hesitate to return to the Emergency Department for any new, worsening or concerning symptoms.

## 2017-10-26 NOTE — ED Notes (Signed)
Patient has narcolepsy and sleep apnea-uses c-pap at night at home-O2 sat decreased to 82%-O2 placed 4l/[place] with sat increased 96%

## 2017-12-04 ENCOUNTER — Encounter: Payer: Self-pay | Admitting: Acute Care

## 2017-12-04 ENCOUNTER — Ambulatory Visit (INDEPENDENT_AMBULATORY_CARE_PROVIDER_SITE_OTHER): Payer: Medicaid Other | Admitting: Acute Care

## 2017-12-04 VITALS — BP 124/60 | HR 76 | Ht 70.0 in | Wt 320.2 lb

## 2017-12-04 DIAGNOSIS — G4733 Obstructive sleep apnea (adult) (pediatric): Secondary | ICD-10-CM

## 2017-12-04 NOTE — Patient Instructions (Addendum)
  You must wear your BiPAP every night for at least 6 hours. Enroll on AirView today Please order face mask of choice  You must use  BiPAP at bedtime.  Goal is to wear for at least 6 hours each night for maximal clinical benefit. Continue to work on weight loss, as the link between excess weight  and sleep apnea is well established.  Do not drive if sleepy. Do not take sedating medications. Failure using your BiPAP device increases your risk of stroke,  heart disease, pulmonary hypertension and death. Remember to clean mask, tubing, filter, and reservoir once weekly with soapy water.  Follow up with Dr. Vassie LollAlva or NP in 3 weeks or before as needed.

## 2017-12-04 NOTE — Assessment & Plan Note (Signed)
Non-compliance with BIPAP Continued Daytime sleepiness Plan: You must wear your BiPAP every night for at least 6 hours. Enroll on AirView today Please order face mask of choice  You must use  BiPAP at bedtime.  Goal is to wear for at least 6 hours each night for maximal clinical benefit. Continue to work on weight loss, as the link between excess weight  and sleep apnea is well established.  Do not drive if sleepy. Do not take sedating medications. Failure using your BiPAP device increases your risk of stroke,  heart disease, pulmonary hypertension and death. Remember to clean mask, tubing, filter, and reservoir once weekly with soapy water.  Follow up with Dr. Elsworth Soho or NP in 3 weeks or before as needed.  We will determine if we will clear you for dental su.rgery after compliance is met.

## 2017-12-04 NOTE — Progress Notes (Signed)
History of Present Illness Darryl Moody is a 37 y.o. male current some day smoker with narcolepsy and OHS/OSA, on BiPAP, but compliance issues.  He is followed by Dr. Elsworth Soho   HPI 37 yo with polysubstance abuse (cocaine)  with narcolepsy and OHS/OSA seen for pulmonary consult during hospital admit 07/27/15 .    12/04/2017 Follow up for BiPAP use: Pt presents for follow up regarding his BiPAP therapy. . He has not been seen in the office x 2.5 years.He is asleep in the exam room  and difficult to awaken. With sternal rub he was able to answer 1 question with slurred speech before falling asleep again. He is snoring.He states he uses his CPAP 2-3 times a week. We do not have a Down Load. He is unable to answer questions due to the fact he keeps falling asleep. He states he is not using any illegal substances. We explained that he needs to go to the hospital as we were concerned his CO2 was elevated due to his medical non-compliance.We explained that we were concerned that the reason for his sleepiness was hypercapnia.When we were going to call EMS to transport the patient to the hospital, he awoke and refused to go. We explained the risks of hypercapnic respiratory failure, and the patient still refused to allow [place] to transport him to the ED.    When he woke up for long enough to talk, he explained he needs dental surgery and his dentist would not use anesthesia with his current respiratory co-morbidities. The patient said he will use his BiPAP long enough for [place] to qualify him for surgery.He states this sleepiness is normal for him. He denies fever, chest pain, orthopnea or hemoptysis.    Test Results: Sleep Study 08/2015 RECOMMENDATIONS Trial of BiPAP therapy on 19/15 cm H2O with a Large size [employer] Full Face Mask Simplus mask and heated humidification. This patient may need supplemental oxygen during sleep in addition to PAP therapy Avoid alcohol, sedatives and other CNS depressants  that may worsen sleep apnea and disrupt normal sleep architecture. Sleep hygiene should be reviewed to assess factors that may improve sleep quality. Weight management and regular exercise should be initiated or continued.  CBC Latest Ref Rng & Units 07/24/2017 06/16/2016 06/15/2016  WBC 4.0 - 10.5 K/uL 4.7 5.3 5.7  Hemoglobin 13.0 - 17.0 g/dL 14.9 13.5 15.3  Hematocrit 39.0 - 52.0 % 45.9 42.6 46.0  Platelets 150 - 400 K/uL 289 229 289    BMP Latest Ref Rng & Units 07/24/2017 06/17/2016 06/16/2016  Glucose 65 - 99 mg/dL 108(H) 133(H) 124(H)  BUN 6 - 20 mg/dL 9 7 8   Creatinine 0.61 - 1.24 mg/dL 1.02 0.98 1.19  Sodium 135 - 145 mmol/L 139 139 140  Potassium 3.5 - 5.1 mmol/L 3.7 3.6 3.3(L)  Chloride 101 - 111 mmol/L 105 105 105  CO2 22 - 32 mmol/L 29 27 27   Calcium 8.9 - 10.3 mg/dL 8.8(L) 8.8(L) 8.6(L)    BNP    Component Value Date/Time   BNP 15.8 06/14/2016 0951     Past medical hx Past Medical History:  Diagnosis Date  . Narcolepsy   . Obesity   . Sleep apnea    does use cpap     Social History   Tobacco Use  . Smoking status: Current Some Day Smoker    Packs/day: 0.50    Types: Cigarettes  . Smokeless tobacco: Never Used  Substance Use Topics  . Alcohol use: No  Alcohol/week: 0.0 oz  . Drug use: Yes    Types: Cocaine    Comment: wife states no longer using cocaine for a couple of months as of 07/23/17    Mr.Westerhold reports that he has been smoking cigarettes.  He has been smoking about 0.50 packs per day. he has never used smokeless tobacco. He reports that he uses drugs. Drug: Cocaine. He reports that he does not drink alcohol.  Tobacco Cessation: Current Every Day smoker with occasional cocaine use.  Past surgical hx, Family hx, Social hx all reviewed.  Current Outpatient Medications on File Prior to Visit  Medication Sig  . [DISCONTINUED] amLODipine (NORVASC) 5 MG tablet Take 1 tablet (5 mg total) by mouth daily. (Patient not taking: Reported on 08/10/2015)    . [DISCONTINUED] cloNIDine (CATAPRES) 0.1 MG tablet Take 1 tablet (0.1 mg total) by mouth 3 (three) times daily. (Patient not taking: Reported on 08/10/2015)  . [DISCONTINUED] pantoprazole (PROTONIX) 40 MG tablet Take 1 tablet (40 mg total) by mouth at bedtime. (Patient not taking: Reported on 08/10/2015)   No current facility-administered medications on file prior to visit.      Allergies  Allergen Reactions  . Hydrocodone-Acetaminophen Anaphylaxis  . Shrimp [Shellfish Allergy] Swelling and Other (See Comments)    Throat swells    Review Of Systems:  Constitutional:   No  weight loss, night sweats,  Fevers, chills, fatigue, or  lassitude.  HEENT:   No headaches,  Difficulty swallowing,  +++Tooth/dental problems, or  Sore throat,                No sneezing, itching, ear ache, nasal congestion, post nasal drip,   CV:  No chest pain,  Orthopnea, PND, swelling in lower extremities, anasarca, dizziness, palpitations, syncope.   GI  No heartburn, indigestion, abdominal pain, nausea, vomiting, diarrhea, change in bowel habits, loss of appetite, bloody stools.   Resp: No shortness of breath with exertion or at rest.  No excess mucus, no productive cough,  No non-productive cough,  No coughing up of blood.  No change in color of mucus.  No wheezing.  No chest wall deformity  Skin: no rash or lesions.  GU: no dysuria, change in color of urine, no urgency or frequency.  No flank pain, no hematuria   MS:  No joint pain or swelling.  No decreased range of motion.  No back pain.  Psych:  No change in mood or affect. No depression or anxiety.  No memory loss.   Vital Signs BP 124/60 (BP Location: Left Arm, Patient Position: Sitting, Cuff Size: Normal)   Pulse 76   Ht 5' 10"  (1.778 m)   Wt (!) 320 lb 3.2 oz (145.2 kg)   BMI 45.94 kg/m    Physical Exam:  General- Obese male asleep in exam room, snoring respirations ENT: No sinus tenderness, TM clear, pale nasal mucosa, no oral  exudate,no post nasal drip, no LAN Cardiac: S1, S2, regular rate and rhythm, no murmur Chest: No wheeze/ rales/ dullness; no accessory muscle use, no nasal flaring, no sternal retractions Abd.: Soft Non-tender, Morbidly obese Ext: No clubbing cyanosis, edema Neuro:  normal strength Skin: No rashes, warm and dry Psych: normal mood and behavior   Assessment/Plan  Obstructive sleep apnea Non-compliance with BIPAP Continued Daytime sleepiness Plan: You must wear your BiPAP every night for at least 6 hours. Enroll on AirView today Please order face mask of choice  You must use  BiPAP at bedtime.  Goal is  to wear for at least 6 hours each night for maximal clinical benefit. Continue to work on weight loss, as the link between excess weight  and sleep apnea is well established.  Do not drive if sleepy. Do not take sedating medications. Failure using your BiPAP device increases your risk of stroke,  heart disease, pulmonary hypertension and death. Remember to clean mask, tubing, filter, and reservoir once weekly with soapy water.  Follow up with Dr. Elsworth Soho or NP in 3 weeks or before as needed.  We will determine if we will clear you for dental su.rgery after compliance is met.    Magdalen Spatz, NP 12/04/2017  3:18 PM

## 2017-12-29 ENCOUNTER — Telehealth: Payer: Self-pay | Admitting: Acute Care

## 2017-12-29 ENCOUNTER — Encounter: Payer: Self-pay | Admitting: Acute Care

## 2017-12-29 ENCOUNTER — Ambulatory Visit: Payer: Medicaid Other | Admitting: Acute Care

## 2017-12-29 DIAGNOSIS — G47429 Narcolepsy in conditions classified elsewhere without cataplexy: Secondary | ICD-10-CM | POA: Diagnosis not present

## 2017-12-29 NOTE — Telephone Encounter (Signed)
Please follow up re: getting mask from [facility]. Thanks so much.

## 2017-12-29 NOTE — Progress Notes (Signed)
History of Present Illness Darryl Moody is a 37 y.o. male current every day smoker with polysubstance abuse (cocaine) with narcolepsy and OHS/OSA seen for pulmonary consult during hospital admit 07/27/15 . He is followed by Darryl Moody.   12/29/2017 OV follow up: Pt. Presents for follow up. He was last seen 12/04/2017 for BiPAP follow up therapy. He kept falling asleep in the office and we suggested he go to the hospital, which he refused to do. He was told he needed to be compliant with his BiPAP and return for evaluation. He returns today. He states he is not using his BiPAP because he never got the mask that was ordered 12/04/2017 through [facility] . He did not call the office for follow up. We have confirmation of the order, but the patient states he never received a call.He is somnolent today. He is slurring his words. He states he has not taken anything to make him this sleepy.He cannot wear his BiPAP machine until he has a mask that fits.[facility] states he has a balance owed and that is why they did not supply the mask. He states he was never called and told that. Additionally he states Medicaid pays everything for him. He has no idea what the bill is for or why Medicaid did not pay the bill.Continued non-compliance 2/2 not having a mask.He is asking for a stimulant to help keep him awake. He does not work, he does not drive.  Test Results: Sleep Study 08/2015 RECOMMENDATIONS Trial of BiPAP therapy on 19/15 cm H2O with a Large size [employer] Full Face Mask Simplus mask and heated humidification. This patient may need supplemental oxygen during sleep in addition to PAP therapy Avoid alcohol, sedatives and other CNS depressants that may worsen sleep apnea and disrupt normal sleep architecture. Sleep hygiene should be reviewed to assess factors that may improve sleep quality. Weight management and regular exercise should be initiated or continued.   CBC Latest Ref Rng &  Units 07/24/2017 06/16/2016 06/15/2016  WBC 4.0 - 10.5 K/uL 4.7 5.3 5.7  Hemoglobin 13.0 - 17.0 g/dL 16.114.9 09.613.5 04.515.3  Hematocrit 39.0 - 52.0 % 45.9 42.6 46.0  Platelets 150 - 400 K/uL 289 229 289    BMP Latest Ref Rng & Units 07/24/2017 06/17/2016 06/16/2016  Glucose 65 - 99 mg/dL 409(W108(H) 119(J133(H) 478(G124(H)  BUN 6 - 20 mg/dL 9 7 8   Creatinine 0.61 - 1.24 mg/dL 9.561.02 2.130.98 0.861.19  Sodium 135 - 145 mmol/L 139 139 140  Potassium 3.5 - 5.1 mmol/L 3.7 3.6 3.3(L)  Chloride 101 - 111 mmol/L 105 105 105  CO2 22 - 32 mmol/L 29 27 27   Calcium 8.9 - 10.3 mg/dL 5.7(Q8.8(L) 4.6(N8.8(L) 6.2(X8.6(L)    BNP    Component Value Date/Time   BNP 15.8 06/14/2016 0951    ProBNP No results found for: PROBNP  PFT No results found for: FEV1PRE, FEV1POST, FVCPRE, FVCPOST, TLC, DLCOUNC, PREFEV1FVCRT, PSTFEV1FVCRT  No results found.   Past medical hx Past Medical History:  Diagnosis Date  . Narcolepsy   . Obesity   . Sleep apnea    does use cpap     Social History   Tobacco Use  . Smoking status: Current Some Day Smoker    Packs/day: 0.50    Types: Cigarettes  . Smokeless tobacco: Never Used  Substance Use Topics  . Alcohol use: No    Alcohol/week: 0.0 oz  . Drug use: Yes    Types: Cocaine  Comment: wife states no longer using cocaine for a couple of months as of 07/23/17    DarrylMoody reports that he has been smoking cigarettes.  He has been smoking about 0.50 packs per day. he has never used smokeless tobacco. He reports that he uses drugs. Drug: Cocaine. He reports that he does not drink alcohol.  Tobacco Cessation: Current some day smoker  Past surgical hx, Family hx, Social hx all reviewed.  Current Outpatient Medications on File Prior to Visit  Medication Sig  . [DISCONTINUED] amLODipine (NORVASC) 5 MG tablet Take 1 tablet (5 mg total) by mouth daily. (Patient not taking: Reported on 08/10/2015)  . [DISCONTINUED] cloNIDine (CATAPRES) 0.1 MG tablet Take 1 tablet (0.1 mg total) by mouth 3 (three) times  daily. (Patient not taking: Reported on 08/10/2015)  . [DISCONTINUED] pantoprazole (PROTONIX) 40 MG tablet Take 1 tablet (40 mg total) by mouth at bedtime. (Patient not taking: Reported on 08/10/2015)   No current facility-administered medications on file prior to visit.      Allergies  Allergen Reactions  . Hydrocodone-Acetaminophen Anaphylaxis  . Shrimp [Shellfish Allergy] Swelling and Other (See Comments)    Throat swells    Review Of Systems:  Constitutional:   No  weight loss, night sweats,  Fevers, chills, fatigue, or  lassitude.  HEENT:   No headaches,  Difficulty swallowing,  Tooth/dental problems, or  Sore throat,                No sneezing, itching, ear ache, nasal congestion, post nasal drip,   CV:  No chest pain,  Orthopnea, PND, swelling in lower extremities, anasarca, dizziness, palpitations, syncope.   GI  No heartburn, indigestion, abdominal pain, nausea, vomiting, diarrhea, change in bowel habits, loss of appetite, bloody stools.   Resp: No shortness of breath with exertion or at rest.  No excess mucus, no productive cough,  No non-productive cough,  No coughing up of blood.  No change in color of mucus.  No wheezing.  No chest wall deformity  Skin: no rash or lesions.  GU: no dysuria, change in color of urine, no urgency or frequency.  No flank pain, no hematuria   MS:  No joint pain or swelling.  No decreased range of motion.  No back pain.  Psych:  No change in mood or affect. No depression or anxiety.  No memory loss.   Vital Signs BP 128/64 (BP Location: Left Arm, Cuff Size: Normal)   Pulse (!) 104   Ht 5\' 10"  (1.778 m)   Wt (!) 313 lb (142 kg)   SpO2 98%   BMI 44.91 kg/m    Physical Exam:  General- No distress,  Somnolent, speech is slurred ENT: No sinus tenderness, TM clear, pale nasal mucosa, no oral exudate,no post nasal drip, no LAN Cardiac: S1, S2, regular rate and rhythm, no murmur Chest: No wheeze/ rales/ dullness; no accessory muscle use,  no nasal flaring, no sternal retractions Abd.: Soft Non-tender, non-distended, obese Ext: No clubbing cyanosis, edema Neuro:  normal strength, somnolent, speech is slurred, but clears when asked to speak clearly. 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 We will place another order for mask.  Please follow  up with [facility] re: bill payment and mask delivery Continue on BiPAP at bedtime. You appear to be benefiting from the treatment Goal is to wear for at least 6 hours each night for maximal clinical benefit. Continue to work on weight loss, as the link between excess weight  and sleep apnea is well established.  Do not drive if sleepy. Remember to clean mask, tubing, filter, and reservoir once weekly with soapy water.  Follow up with Dr. Vassie Loll or NP once mask has been obtained and BiPap has been worn 30-90 days with down Load. Please contact office for sooner follow up if symptoms do not improve or worsen or seek emergency care      Continue to work on weight loss  Bevelyn Ngo, NP 12/29/2017  8:00 PM

## 2017-12-29 NOTE — Patient Instructions (Addendum)
We will call [facility] again today for a mask and equipment for the BiPAP. We will place another order for mask.  Please follow up with [facility] re: bill payment and mask delivery Continue on BiPAP at bedtime. You appear to be benefiting from the treatment Goal is to wear for at least 6 hours each night for maximal clinical benefit. Continue to work on weight loss, as the link between excess weight  and sleep apnea is well established.  Do not drive if sleepy. Remember to clean mask, tubing, filter, and reservoir once weekly with soapy water.  Follow up with Dr. Vassie LollAlva or NP once mask has been obtained and BiPap has been worn 30-90 days with down Load. Please contact office for sooner follow up if symptoms do not improve or worsen or seek emergency care

## 2017-12-29 NOTE — Assessment & Plan Note (Signed)
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 We will place another order for mask.  Please follow up with [facility] re: bill payment and mask delivery Continue on BiPAP at bedtime. You appear to be benefiting from the treatment Goal is to wear for at least 6 hours each night for maximal clinical benefit. Continue to work on weight loss, as the link between excess weight  and sleep apnea is well established.  Do not drive if sleepy. Remember to clean mask, tubing, filter, and reservoir once weekly with soapy water.  Follow up with Dr. Vassie LollAlva or NP once mask has been obtained and BiPap has been worn 30-90 days with down Load. Please contact office for sooner follow up if symptoms do not improve or worsen or seek emergency care

## 2018-01-01 NOTE — Telephone Encounter (Signed)
Called and spoke with pt asking if he had ever [facility] a [facility] but pt stated he has not received one yet.  Stated to pt I would call [facility] to follow-up on this for him and that I would call him once I had info for him.  [facility] [place] [facility] who stated that pt has a balance he has to take care of before he can receive new supplies.  Called pt letting him know this information.  Pt expressed understanding. Pt stated he would call [facility] to figure out why he has a balance and then go from there with receiving a new mask.  Nothing further needed at this current time.

## 2018-01-12 ENCOUNTER — Ambulatory Visit: Payer: Medicaid Other | Admitting: Pulmonary Disease

## 2018-03-08 ENCOUNTER — Encounter (HOSPITAL_COMMUNITY): Payer: Self-pay | Admitting: Emergency Medicine

## 2018-03-08 ENCOUNTER — Emergency Department (HOSPITAL_COMMUNITY)
Admission: EM | Admit: 2018-03-08 | Discharge: 2018-03-08 | Disposition: A | Discharge status: Home / Self Care | Payer: Medicaid Other | Attending: Emergency Medicine | Admitting: Emergency Medicine

## 2018-03-08 ENCOUNTER — Emergency Department (HOSPITAL_COMMUNITY): Payer: Medicaid Other

## 2018-03-08 ENCOUNTER — Other Ambulatory Visit: Payer: Self-pay

## 2018-03-08 DIAGNOSIS — G47419 Narcolepsy without cataplexy: Secondary | ICD-10-CM | POA: Insufficient documentation

## 2018-03-08 DIAGNOSIS — R079 Chest pain, unspecified: Secondary | ICD-10-CM | POA: Insufficient documentation

## 2018-03-08 DIAGNOSIS — F17210 Nicotine dependence, cigarettes, uncomplicated: Secondary | ICD-10-CM | POA: Diagnosis not present

## 2018-03-08 LAB — BASIC METABOLIC PANEL
Anion gap: 9 (ref 5–15)
BUN: 10 mg/dL (ref 6–20)
CO2: 28 mmol/L (ref 22–32)
Calcium: 9.2 mg/dL (ref 8.9–10.3)
Chloride: 106 mmol/L (ref 101–111)
Creatinine, Ser: 1.2 mg/dL (ref 0.61–1.24)
GFR calc Af Amer: >60 mL/min (ref >60)
GFR calc non Af Amer: >60 mL/min (ref >60)
Glucose, Bld: 117 mg/dL — ABNORMAL HIGH (ref 65–99)
Potassium: 3.8 mmol/L (ref 3.5–5.1)
Sodium: 143 mmol/L (ref 135–145)

## 2018-03-08 LAB — CBC
HCT: 44.3 % (ref 39.0–52.0)
Hemoglobin: 14.7 g/dL (ref 13.0–17.0)
MCH: 30.2 pg (ref 26.0–34.0)
MCHC: 33.2 g/dL (ref 30.0–36.0)
MCV: 91.2 fL (ref 78.0–100.0)
Platelets: 308 10*3/uL (ref 150–400)
RBC: 4.86 MIL/uL (ref 4.22–5.81)
RDW: 13.6 % (ref 11.5–15.5)
WBC: 6 10*3/uL (ref 4.0–10.5)

## 2018-03-08 LAB — BLOOD GAS, ARTERIAL
Acid-Base Excess: 2.5 mmol/L — ABNORMAL HIGH (ref 0.0–2.0)
BICARBONATE: 28.2 mmol/L — AB (ref 20.0–28.0)
DRAWN BY: 232811
FIO2: 21
O2 Saturation: 96.6 %
PATIENT TEMPERATURE: 98.2
pCO2 arterial: 49.4 mmHg — ABNORMAL HIGH (ref 32.0–48.0)
pH, Arterial: 7.374 (ref 7.350–7.450)
pO2, Arterial: 88.5 mmHg (ref 83.0–108.0)

## 2018-03-08 LAB — I-STAT TROPONIN, ED: Troponin i, poc: 0 ng/mL (ref 0.00–0.08)

## 2018-03-08 MED ORDER — NAPROXEN 500 MG PO TABS: 500.0000 mg | ORAL_TABLET | Freq: Two times a day (BID) | ORAL | 0 refills | Status: DC | PRN

## 2018-03-08 NOTE — ED Provider Notes (Signed)
[facility]-EMERGENCY DEPT Provider Note   CSN: [id number] Arrival date & time: 03/08/18  1940     History   Chief Complaint Chief Complaint  Patient presents with  . Chest Pain    HPI Darryl Moody is a 37 y.o. male.  The history is provided by the patient and medical records. No language interpreter was used.  Chest Pain   Associated symptoms include nausea (Resolved).     Darryl Moody is a 37 y.o. male  with a PMH of narcolepsy, sleep apnea who presents to the Emergency Department complaining of intermittent central to right sided chest pain x2 days.  Pain is worse with certain movements, especially with motion of sitting up or twisting.  No pain with exertion.  He does endorse diarrhea and nausea after drinking milk Friday as well.  This is now resolved.  Denies any trouble breathing.  No medications taken prior to arrival for symptoms.  No alleviating factors noted.   Past Medical History:  Diagnosis Date  . Narcolepsy   . Obesity   . Sleep apnea    does use cpap    Patient Active Problem List   Diagnosis Date Noted  . Narcolepsy 08/10/2015  . Acute encephalopathy 07/27/2015  . Respiratory failure (HCC) 07/27/2015  . Acute on chronic respiratory failure (HCC)   . Obstructive sleep apnea     Past Surgical History:  Procedure Laterality Date  . MULTIPLE EXTRACTIONS WITH ALVEOLOPLASTY N/A 07/24/2017   Procedure: attempted MULTIPLE EXTRACTION WITH ALVEOLOPLASTY;  Surgeon: Ocie Doyne, DDS;  Location: [facility] OR;  Service: Oral Surgery;  Laterality: N/A;  . NO PAST SURGERIES          Home Medications    Prior to Admission medications   Medication Sig Start Date End Date Taking? Authorizing Provider  naproxen (NAPROSYN) 500 MG tablet Take 1 tablet (500 mg total) by mouth 2 (two) times daily as needed. 03/08/18   Alexxander Kurt, Chase Picket, PA-C    Family History Family History  Problem Relation Age of Onset  . Hypertension Other      Social History Social History   Tobacco Use  . Smoking status: Current Some Day Smoker    Packs/day: 0.50    Types: Cigarettes  . Smokeless tobacco: Never Used  Substance Use Topics  . Alcohol use: Not Currently    Alcohol/week: 0.0 oz  . Drug use: Not Currently    Types: Cocaine    Comment: wife states no longer using cocaine for a couple of months as of 07/23/17     Allergies   Hydrocodone-acetaminophen and Shrimp [shellfish allergy]   Review of Systems Review of Systems  Cardiovascular: Positive for chest pain.  Gastrointestinal: Positive for diarrhea (Resolved) and nausea (Resolved).  All other systems reviewed and are negative.    Physical Exam Updated Vital Signs BP (!) 138/124   Pulse 91   Temp 97.8 F (36.6 C) (Oral)   Resp (!) 21   Ht  (1.778 m)   Wt (!) 149.7 kg (330 lb)   SpO2 93%   BMI 47.35 kg/m   Physical Exam  Constitutional: He is oriented to person, place, and time. He appears well-developed and well-nourished. No distress.  HENT:  Head: Normocephalic and atraumatic.  Cardiovascular: Normal rate, regular rhythm and normal heart sounds.  No murmur heard. Pulmonary/Chest: Effort normal and breath sounds normal. No respiratory distress. He has no wheezes. He has no rales.  Exquisitely tender chest wall.  Abdominal: Soft. He exhibits no distension. There is no tenderness.  Neurological: He is alert and oriented to person, place, and time.  Skin: Skin is warm and dry.  Nursing note and vitals reviewed.    ED Treatments / Results  Labs (all labs ordered are listed, but only abnormal results are displayed) Labs Reviewed  BASIC METABOLIC PANEL - Abnormal; Notable for the following components:      Result Value   Glucose, Bld 117 (*)    All other components within normal limits  BLOOD GAS, ARTERIAL - Abnormal; Notable for the following components:   pCO2 arterial 49.4 (*)    Bicarbonate 28.2 (*)    Acid-Base Excess 2.5 (*)    All  other components within normal limits  CBC  I-STAT TROPONIN, ED    EKG EKG Interpretation  Date/Time:  Sunday March 08 2018 19:49:57 EDT Ventricular Rate:  90 PR Interval:    QRS Duration: 83 QT Interval:  348 QTC Calculation: 426 R Axis:   53 Text Interpretation:  Sinus rhythm No significant change since last tracing Confirmed by Raeford Razor [phone]) on 03/08/2018 9:41:17 PM Also confirmed by Raeford Razor [phone]), editor Elita Quick ([id number])  on 03/09/2018 7:45:02 AM   Radiology Dg Chest 2 View  Result Date: 03/08/2018 CLINICAL DATA:  Pt having central and right side chest pains since Friday with SOB and coughing. Current smoker since age 52. No hx of HTN, DM, or asthma. EXAM: CHEST - 2 VIEW COMPARISON:  06/16/2016 FINDINGS: Cardiac silhouette is normal in size and configuration. No mediastinal or hilar masses. No evidence of adenopathy. Clear lungs.  No pleural effusion or pneumothorax. Skeletal structures are unremarkable. IMPRESSION: No active cardiopulmonary disease. Electronically Signed   By: Amie Portland M.D.   On: 03/08/2018 20:29    Procedures Procedures (including critical care time)  Medications Ordered in ED Medications - No data to display   Initial Impression / Assessment and Plan / ED Course  I have reviewed the triage vital signs and the nursing notes.  Pertinent labs & imaging results that were available during my care of the patient were reviewed by me and considered in my medical decision making (see chart for details).    Darryl Moody is a 37 y.o. male who presents to ED for chest pain x 2 days. No shortness of breath, nausea, vomiting, diaphoresis.  Worse with certain movements and exquisitely tender to palpation on exam.  Seems much more consistent with musculoskeletal etiology.  EKG nonischemic and troponin negative.  PERC negative.  Low risk heart score.  During examination, patient continuing to fall asleep.  He will answer 1 or 2 questions  then falls asleep.  I will have to wake him up again - history of narcolepsy.  He denies any substance use.  Has very severe sleep apnea as well.  He has been seen by pulmonology and this appears to be consistent with what they have seen in clinic as well.  ABG was obtained with CO2 of 49. Patient's symptoms unlikely to be of cardiopulmonary etiology. Labs and imaging reviewed again prior to discharge. Patient has been advised to return to the ED if development of any exertional chest pain, trouble breathing, new/worsening symptoms or for any additional concerns. Evaluation does not show pathology that would require ongoing emergent intervention or inpatient treatment. Encouraged to follow up with PCP and pulmonology. Patient understands return precautions and follow up plan. All questions answered.  Patient discussed with Dr. Juleen China who  agrees with treatment plan.    Final Clinical Impressions(s) / ED Diagnoses   Final diagnoses:  Chest pain with low risk for cardiac etiology    ED Discharge Orders        Ordered    naproxen (NAPROSYN) 500 MG tablet  2 times daily PRN     03/08/18 2248       Clifton Safley, Chase Picket, PA-C 03/09/18 1243    Raeford Razor, MD 03/09/18 1659

## 2018-03-08 NOTE — Discharge Instructions (Addendum)
It was my pleasure taking care of you today!   Naproxen as needed for pain.   You were seen in the Emergency Department today for chest pain.  As we have discussed, today?s blood work and imaging are normal, but you may require further testing.  Please call your primary care physician to schedule a follow up appointment to discuss your ER visit today.   Return to the Emergency Department if you experience new or worsening symptoms, any additional concerns.

## 2018-03-08 NOTE — ED Triage Notes (Signed)
Pt is c/o center to right sided chest pain that started on Friday   Pt states it comes with certain movement and describes as a pressure  Pt c/o some shortness of breath  Denies that the pain radiates

## 2018-06-25 ENCOUNTER — Ambulatory Visit (HOSPITAL_COMMUNITY)
Admission: EM | Admit: 2018-06-25 | Discharge: 2018-06-25 | Disposition: A | Discharge status: Home / Self Care | Payer: Medicaid Other

## 2018-06-25 ENCOUNTER — Encounter (HOSPITAL_COMMUNITY): Payer: Self-pay

## 2018-06-25 ENCOUNTER — Emergency Department (HOSPITAL_COMMUNITY)
Admission: EM | Admit: 2018-06-25 | Discharge: 2018-06-25 | Disposition: A | Discharge status: Home / Self Care | Payer: Medicaid Other | Attending: Emergency Medicine | Admitting: Emergency Medicine

## 2018-06-25 DIAGNOSIS — Z5321 Procedure and treatment not carried out due to patient leaving prior to being seen by health care provider: Secondary | ICD-10-CM | POA: Insufficient documentation

## 2018-06-25 DIAGNOSIS — H538 Other visual disturbances: Secondary | ICD-10-CM | POA: Insufficient documentation

## 2018-06-25 NOTE — ED Notes (Signed)
Pt not in hallway at this time

## 2018-06-25 NOTE — ED Notes (Signed)
Called for triage, no answer

## 2018-06-25 NOTE — ED Notes (Signed)
Pt left hallway A to find the cafeteria. He stated that he will return.

## 2018-06-25 NOTE — ED Triage Notes (Addendum)
Pt presents with sudden onset of blurred vision to both eyes that began at 1400 today.  Pt reports having blurred vision to L eye in the past which he did not follow up with; reports vision is getting better now.  Pt denies any headache or numbness.

## 2018-06-25 NOTE — ED Provider Notes (Signed)
Patient placed in Quick Look pathway, seen and evaluated   Chief Complaint: vision change  HPI:   Darryl Moody is a 37 y.o. male with hx of Narcolepsy and is an every day smoker who presents to the ED with c/o blurry vision that started today. Patient's friend reports that patient has been outside in the heat working on his car and she didn't know if that had something to do with or maybe his BP was high.   ROS: Eyes: blurry vision bilateral  Physical Exam:  BP 138/83 (BP Location: Right Arm)   Pulse 94   Temp 99.5 F (37.5 C) (Oral)   Resp 20   Ht 5\' 11"  (1.803 m)   Wt (!) 154.2 kg   SpO2 96%   BMI 47.42 kg/m    Gen: No distress  Neuro: Awake and Alert  Skin: Warm and dry  Eyes: Sclera with irritation  ** Note: patient sleeping thorough exam and difficult to wake due to his Narcolepsy**  Initiation of care has begun. The patient has been counseled on the process, plan, and necessity for staying for the completion/evaluation, and the remainder of the medical screening examination    Janne Napoleoneese, Hope M, NP 06/25/18 1810    Lorre NickAllen, Anthony, MD 06/25/18 2241

## 2018-07-08 ENCOUNTER — Emergency Department (HOSPITAL_COMMUNITY)
Admission: EM | Admit: 2018-07-08 | Discharge: 2018-07-08 | Disposition: A | Discharge status: Home / Self Care | Payer: Medicaid Other | Attending: Emergency Medicine | Admitting: Emergency Medicine

## 2018-07-08 ENCOUNTER — Emergency Department (HOSPITAL_COMMUNITY): Payer: Medicaid Other

## 2018-07-08 ENCOUNTER — Encounter (HOSPITAL_COMMUNITY): Payer: Self-pay | Admitting: Emergency Medicine

## 2018-07-08 ENCOUNTER — Other Ambulatory Visit: Payer: Self-pay

## 2018-07-08 DIAGNOSIS — F17210 Nicotine dependence, cigarettes, uncomplicated: Secondary | ICD-10-CM | POA: Insufficient documentation

## 2018-07-08 DIAGNOSIS — R51 Headache: Secondary | ICD-10-CM | POA: Diagnosis present

## 2018-07-08 DIAGNOSIS — R519 Headache, unspecified: Secondary | ICD-10-CM

## 2018-07-08 DIAGNOSIS — R200 Anesthesia of skin: Secondary | ICD-10-CM | POA: Insufficient documentation

## 2018-07-08 DIAGNOSIS — H538 Other visual disturbances: Secondary | ICD-10-CM | POA: Insufficient documentation

## 2018-07-08 LAB — BASIC METABOLIC PANEL
ANION GAP: 10 (ref 5–15)
BUN: 12 mg/dL (ref 6–20)
CHLORIDE: 102 mmol/L (ref 98–111)
CO2: 29 mmol/L (ref 22–32)
Calcium: 9.6 mg/dL (ref 8.9–10.3)
Creatinine, Ser: 1 mg/dL (ref 0.61–1.24)
GFR calc Af Amer: >60 mL/min (ref >60)
GFR calc non Af Amer: >60 mL/min (ref >60)
Glucose, Bld: 112 mg/dL — ABNORMAL HIGH (ref 70–99)
POTASSIUM: 3.6 mmol/L (ref 3.5–5.1)
Sodium: 141 mmol/L (ref 135–145)

## 2018-07-08 LAB — CBC
HEMATOCRIT: 45.8 % (ref 39.0–52.0)
HEMOGLOBIN: 14.7 g/dL (ref 13.0–17.0)
MCH: 29.2 pg (ref 26.0–34.0)
MCHC: 32.1 g/dL (ref 30.0–36.0)
MCV: 90.9 fL (ref 78.0–100.0)
Platelets: 319 10*3/uL (ref 150–400)
RBC: 5.04 MIL/uL (ref 4.22–5.81)
RDW: 13.5 % (ref 11.5–15.5)
WBC: 4.9 10*3/uL (ref 4.0–10.5)

## 2018-07-08 LAB — CBG MONITORING, ED: Glucose-Capillary: 98 mg/dL (ref 70–99)

## 2018-07-08 LAB — URINALYSIS, ROUTINE W REFLEX MICROSCOPIC
Bilirubin Urine: NEGATIVE
Glucose, UA: NEGATIVE mg/dL
HGB URINE DIPSTICK: NEGATIVE
Ketones, ur: NEGATIVE mg/dL
Leukocytes, UA: NEGATIVE
NITRITE: NEGATIVE
PH: 6 (ref 5.0–8.0)
Protein, ur: NEGATIVE mg/dL
SPECIFIC GRAVITY, URINE: 1.024 (ref 1.005–1.030)

## 2018-07-08 NOTE — ED Notes (Signed)
Pt was sleeping in wheelchair in the waiting area.  SIgnificant other is with him, she states that he has narcolepsy and sleeps "all the time and is difficult to wake up and that's normal for him". Pt appears to have significant sleep apnea.  Pt is difficult to arouse.  Pt is here due to HA and loss of vision x2 weeks, this issue is intermittent.  Pt was able to transfer to stretcher from wheelchair but falls back asleep as soon as he lays down, difficulty arousing him again to get him changed into a gown.

## 2018-07-08 NOTE — ED Notes (Signed)
Pt significant other would also like pt tested for herpes.  When asked if he has any lesions she states "no"

## 2018-07-08 NOTE — Discharge Instructions (Addendum)
Follow up with your eye doctor today at 4pm as scheduled  Take ibuprofen and tylenol for your headache

## 2018-07-08 NOTE — ED Notes (Signed)
Urinal was given to pt and advised pt and family member that urine sample is needed

## 2018-07-08 NOTE — ED Notes (Signed)
Pt was asleep when I came to check on him.  Pt appears to be having sleep apnea and was aonly arousable to painful stimuli and then fell back asleep

## 2018-07-08 NOTE — ED Notes (Signed)
Patient transported to CT 

## 2018-07-08 NOTE — ED Triage Notes (Addendum)
Onset one week ago developed headache with intermittent blurry vision and right arm numbness.  States seen in ED couple weeks ago with almost same symptoms. Patient states recently also lack of sleep has bilateral red eyes. Alert answering and following commands appropriate. Patient states has a sleep disorder and falls asleep easily.

## 2018-07-09 NOTE — ED Provider Notes (Signed)
[facility] EMERGENCY DEPARTMENT Provider Note   CSN: [id number] Arrival date & time: 07/08/18  4098     History   Chief Complaint Chief Complaint  Patient presents with  . Headache  . Blurred Vision  . Numbness    HPI Darryl Moody is a 37 y.o. male.  HPI 37 year old male presents emergency department with headache over the past month.  He states this is more severe over the past several days.  No fevers or chills.  Reports mild blurred vision out of his left eye for which she has been following with ophthalmology and has a scheduled appointment today at 4 PM with ophthalmology.  Denies no recent change in his vision.  No unilateral arm or leg weakness.  No head trauma.  Headache is moderate in severity.   Past Medical History:  Diagnosis Date  . Narcolepsy   . Obesity   . Sleep apnea    does use cpap    Patient Active Problem List   Diagnosis Date Noted  . Narcolepsy 08/10/2015  . Acute encephalopathy 07/27/2015  . Respiratory failure (HCC) 07/27/2015  . Acute on chronic respiratory failure (HCC)   . Obstructive sleep apnea     Past Surgical History:  Procedure Laterality Date  . MULTIPLE EXTRACTIONS WITH ALVEOLOPLASTY N/A 07/24/2017   Procedure: attempted MULTIPLE EXTRACTION WITH ALVEOLOPLASTY;  Surgeon: Ocie Doyne, DDS;  Location: [facility] OR;  Service: Oral Surgery;  Laterality: N/A;  . NO PAST SURGERIES          Home Medications    Prior to Admission medications   Medication Sig Start Date End Date Taking? Authorizing Provider  naproxen (NAPROSYN) 500 MG tablet Take 1 tablet (500 mg total) by mouth 2 (two) times daily as needed. 03/08/18   Ward, Chase Picket, PA-C    Family History Family History  Problem Relation Age of Onset  . Hypertension Other     Social History Social History   Tobacco Use  . Smoking status: Current Some Day Smoker    Packs/day: 0.50    Types: Cigarettes  . Smokeless tobacco: Never Used  Substance  Use Topics  . Alcohol use: Not Currently    Alcohol/week: 0.0 standard drinks  . Drug use: Not Currently    Types: Cocaine    Comment: wife states no longer using cocaine for a couple of months as of 07/23/17     Allergies   Hydrocodone-acetaminophen and Shrimp [shellfish allergy]   Review of Systems Review of Systems  All other systems reviewed and are negative.    Physical Exam Updated Vital Signs BP (!) 146/98   Pulse (!) 107   Temp 98.4 F (36.9 C) (Oral)   Resp (!) 25   Ht 5\' 10"  (1.778 m)   Wt (!) 154.2 kg   SpO2 100%   BMI 48.78 kg/m   Physical Exam  Constitutional: He is oriented to person, place, and time. He appears well-developed and well-nourished.  HENT:  Head: Normocephalic and atraumatic.  Eyes: Pupils are equal, round, and reactive to light. EOM are normal.  Neck: Normal range of motion.  Cardiovascular: Normal rate, regular rhythm, normal heart sounds and intact distal pulses.  Pulmonary/Chest: Effort normal and breath sounds normal. No respiratory distress.  Abdominal: Soft. He exhibits no distension. There is no tenderness.  Musculoskeletal: Normal range of motion.  Neurological: He is alert and oriented to person, place, and time.  5/5 strength in major muscle groups of  bilateral upper and  lower extremities. Speech normal. No facial asymetry.   Skin: Skin is warm and dry.  Psychiatric: He has a normal mood and affect. Judgment normal.  Nursing note and vitals reviewed.    ED Treatments / Results  Labs (all labs ordered are listed, but only abnormal results are displayed) Labs Reviewed  BASIC METABOLIC PANEL - Abnormal; Notable for the following components:      Result Value   Glucose, Bld 112 (*)    All other components within normal limits  CBC  URINALYSIS, ROUTINE W REFLEX MICROSCOPIC  CBG MONITORING, ED    EKG EKG Interpretation  Date/Time:  Wednesday July 08 2018 09:35:22 EDT Ventricular Rate:  69 PR Interval:  164 QRS  Duration: 82 QT Interval:  364 QTC Calculation: 390 R Axis:   53 Text Interpretation:  Normal sinus rhythm Nonspecific T wave abnormality Abnormal ECG No STEMI  Confirmed by Alona BeneLong, Joshua [phone]([id number]) on 07/09/2018 5:00:14 PM   Radiology Ct Head Wo Contrast  Result Date: 07/08/2018 CLINICAL DATA:  One-week history of headache, blurry vision and right arm numbness. EXAM: CT HEAD WITHOUT CONTRAST TECHNIQUE: Contiguous axial images were obtained from the base of the skull through the vertex without intravenous contrast. COMPARISON:  07/27/2015 FINDINGS: Brain: Gray-white differentiation is maintained. No CT evidence of acute large territory infarct. No intraparenchymal or extra-axial mass or hemorrhage. Normal size and configuration of the ventricles and the basilar cisterns. No midline shift. Vascular: No hyperdense vessel or unexpected calcification. Skull: No displaced calvarial fracture. Sinuses/Orbits: There is underpneumatization the bilateral frontal sinuses. Minimal polypoid mucosal thickening of the caudal aspects of the bilateral maxillary sinuses, incompletely imaged. Remaining paranasal sinuses and mastoid air cells appear well aerated. No air-fluid levels. Other: Regional soft tissues appear normal. IMPRESSION: Negative noncontrast head CT. Electronically Signed   By: Simonne ComeJohn  Watts M.D.   On: 07/08/2018 13:24    Procedures Procedures (including critical care time)  Medications Ordered in ED Medications - No data to display   Initial Impression / Assessment and Plan / ED Course  I have reviewed the triage vital signs and the nursing notes.  Pertinent labs & imaging results that were available during my care of the patient were reviewed by me and considered in my medical decision making (see chart for details).     Patient's pain is improved.  Head CT is reassuring.  He will be discharged to follow-up with ophthalmology as scheduled later today.  Close primary care follow-up.  Patient is  encouraged to return to the ED for new or worsening symptoms.  Nonspecific headache.  Final Clinical Impressions(s) / ED Diagnoses   Final diagnoses:  Acute nonintractable headache, unspecified headache type  Blurred vision, left eye    ED Discharge Orders    None       Azalia Bilisampos, Torris House, MD 07/09/18 1945

## 2018-09-01 ENCOUNTER — Emergency Department (HOSPITAL_COMMUNITY): Payer: Medicaid Other

## 2018-09-01 ENCOUNTER — Emergency Department (HOSPITAL_COMMUNITY)
Admission: EM | Admit: 2018-09-01 | Discharge: 2018-09-01 | Discharge status: Against Medical Advice | Payer: Medicaid Other | Attending: Emergency Medicine | Admitting: Emergency Medicine

## 2018-09-01 DIAGNOSIS — R0689 Other abnormalities of breathing: Secondary | ICD-10-CM | POA: Diagnosis not present

## 2018-09-01 DIAGNOSIS — R0902 Hypoxemia: Secondary | ICD-10-CM | POA: Diagnosis not present

## 2018-09-01 DIAGNOSIS — G9340 Encephalopathy, unspecified: Secondary | ICD-10-CM | POA: Diagnosis not present

## 2018-09-01 DIAGNOSIS — F17210 Nicotine dependence, cigarettes, uncomplicated: Secondary | ICD-10-CM | POA: Diagnosis not present

## 2018-09-01 DIAGNOSIS — F1910 Other psychoactive substance abuse, uncomplicated: Secondary | ICD-10-CM | POA: Diagnosis not present

## 2018-09-01 DIAGNOSIS — J9612 Chronic respiratory failure with hypercapnia: Secondary | ICD-10-CM | POA: Diagnosis present

## 2018-09-01 DIAGNOSIS — J9622 Acute and chronic respiratory failure with hypercapnia: Secondary | ICD-10-CM

## 2018-09-01 DIAGNOSIS — R4182 Altered mental status, unspecified: Secondary | ICD-10-CM | POA: Insufficient documentation

## 2018-09-01 LAB — I-STAT VENOUS BLOOD GAS, ED
Acid-base deficit: 3 mmol/L — ABNORMAL HIGH (ref 0.0–2.0)
BICARBONATE: 27 mmol/L (ref 20.0–28.0)
O2 Saturation: 58 %
PO2 VEN: 38 mmHg (ref 32.0–45.0)
TCO2: 29 mmol/L (ref 22–32)
pCO2, Ven: 70.1 mmHg (ref 44.0–60.0)
pH, Ven: 7.194 — CL (ref 7.250–7.430)

## 2018-09-01 LAB — COMPREHENSIVE METABOLIC PANEL
ALT: 16 U/L (ref 0–44)
ANION GAP: 9 (ref 5–15)
AST: 19 U/L (ref 15–41)
Albumin: 3.9 g/dL (ref 3.5–5.0)
Alkaline Phosphatase: 50 U/L (ref 38–126)
BUN: 6 mg/dL (ref 6–20)
CHLORIDE: 106 mmol/L (ref 98–111)
CO2: 23 mmol/L (ref 22–32)
Calcium: 8.7 mg/dL — ABNORMAL LOW (ref 8.9–10.3)
Creatinine, Ser: 1.09 mg/dL (ref 0.61–1.24)
GFR calc Af Amer: >60 mL/min (ref >60)
GFR calc non Af Amer: >60 mL/min (ref >60)
Glucose, Bld: 146 mg/dL — ABNORMAL HIGH (ref 70–99)
POTASSIUM: 4 mmol/L (ref 3.5–5.1)
SODIUM: 138 mmol/L (ref 135–145)
TOTAL PROTEIN: 7.9 g/dL (ref 6.5–8.1)
Total Bilirubin: 0.4 mg/dL (ref 0.3–1.2)

## 2018-09-01 LAB — CBC WITH DIFFERENTIAL/PLATELET
ABS IMMATURE GRANULOCYTES: 0.05 10*3/uL (ref 0.00–0.07)
BASOS PCT: 1 %
Basophils Absolute: 0 10*3/uL (ref 0.0–0.1)
Eosinophils Absolute: 0 10*3/uL (ref 0.0–0.5)
Eosinophils Relative: 0 %
HCT: 48.7 % (ref 39.0–52.0)
HEMOGLOBIN: 15 g/dL (ref 13.0–17.0)
IMMATURE GRANULOCYTES: 1 %
LYMPHS PCT: 15 %
Lymphs Abs: 0.9 10*3/uL (ref 0.7–4.0)
MCH: 28.7 pg (ref 26.0–34.0)
MCHC: 30.8 g/dL (ref 30.0–36.0)
MCV: 93.3 fL (ref 80.0–100.0)
MONOS PCT: 7 %
Monocytes Absolute: 0.5 10*3/uL (ref 0.1–1.0)
NEUTROS ABS: 4.6 10*3/uL (ref 1.7–7.7)
NEUTROS PCT: 76 %
Platelets: 340 10*3/uL (ref 150–400)
RBC: 5.22 MIL/uL (ref 4.22–5.81)
RDW: 13.2 % (ref 11.5–15.5)
WBC: 6.1 10*3/uL (ref 4.0–10.5)
nRBC: 0 % (ref 0.0–0.2)

## 2018-09-01 LAB — I-STAT CHEM 8, ED
BUN: 7 mg/dL (ref 6–20)
CHLORIDE: 102 mmol/L (ref 98–111)
Calcium, Ion: 1.15 mmol/L (ref 1.15–1.40)
Creatinine, Ser: 1.2 mg/dL (ref 0.61–1.24)
Glucose, Bld: 147 mg/dL — ABNORMAL HIGH (ref 70–99)
HCT: 50 % (ref 39.0–52.0)
Hemoglobin: 17 g/dL (ref 13.0–17.0)
POTASSIUM: 3.9 mmol/L (ref 3.5–5.1)
SODIUM: 141 mmol/L (ref 135–145)
TCO2: 28 mmol/L (ref 22–32)

## 2018-09-01 LAB — I-STAT TROPONIN, ED: Troponin i, poc: 0.01 ng/mL (ref 0.00–0.08)

## 2018-09-01 LAB — CBG MONITORING, ED: Glucose-Capillary: 133 mg/dL — ABNORMAL HIGH (ref 70–99)

## 2018-09-01 LAB — I-STAT BETA HCG BLOOD, ED (MC, WL, AP ONLY): I-stat hCG, quantitative: <5 m[IU]/mL (ref <5)

## 2018-09-01 NOTE — ED Triage Notes (Signed)
Pt arrives by [facility]cems from the [facility] parking lot where he was found by ems to be sitting on the wet parking lot "unresponsive" Pt arrives to trauma bay on NRB and altered. Pt is responding to pain but agitated. Pt will not answer any questions only mumbles incomprehensible  Speech other than he "wants water".

## 2018-09-01 NOTE — Consult Note (Addendum)
NAME:  Darryl Moody, MRN:  [id number], DOB:  1981/06/20, LOS: 0 ADMISSION DATE:  09/01/2018, CONSULTATION DATE:  09/01/2018 REFERRING MD:  Rodena Medin, CHIEF COMPLAINT:  Hypercapnia  Brief History   37 year old male current every day smoker  with known polysubstance abuse ( Cocaine) and  narcolepsy and  OSA/OHS non-compliant with BiPAP at home. Seen at the pulmonary office He was found sitting in a wet parking lot at [facility],  "Unresponsive".  VBG on arrival to ED with pH of 7.19, PCO2 of 70. Per family nothing unusual in his health today. They stated he did go and play cards last night and when he stays up late and does not get sleep, this happens. He does not work. He does not drive. PCCM were asked to consult for OSA/ OHS. Of note patient has refused CT,and  labs  Past Medical History  Current every day smoker  with known polysubstance abuse ( Cocaine) and  narcolepsy and  OSA/OHS non-compliant with BiPAP at home.  Significant Hospital Events   10/22: ED admission for hypercapnia and AMS  Consults: date of consult/date signed off & final recs:  09/01/2018  Procedures (surgical and bedside):    Significant Diagnostic Tests:  09/01/2018>> VBG Results for Darryl Moody (MRN [id number]) as of 09/01/2018 15:21  Ref. Range 09/01/2018 13:48  pH, Ven Latest Ref Range: 7.250 - 7.430  7.194 (LL)  pCO2, Ven Latest Ref Range: 44.0 - 60.0 mmHg 70.1 (HH)  pO2, Ven Latest Ref Range: 32.0 - 45.0 mmHg 38.0  TCO2 Latest Ref Range: 22 - 32 mmol/L 29  Acid-base deficit Latest Ref Range: 0.0 - 2.0 mmol/L 3.0 (H)  Bicarbonate Latest Ref Range: 20.0 - 28.0 mmol/L 27.0  O2 Saturation Latest Units: % 58.0    Micro Data:  None  Antimicrobials:  None   Subjective:  Pt is lethargic and arouses to loud calling of name. He is on BiPAP, lethargic. He arouses and answers questions when stimulated.Follows some simple commands  Objective   Blood pressure (!) 138/126, pulse 95, resp. rate 20, SpO2 100 %.       No intake or output data in the 24 hours ending 09/01/18 1516 There were no vitals filed for this visit.  Examination: General: Obese male on a stretcher, wearing BiPAP HENT: Thick Neck, NCAT, BiPAP mask Lungs:Bilateral chest excursion, Clear breath sounds, diminished per bases Cardiovascular: S1, S2, RRR, NO RMG, distant HS Abdomen: Obese, NT, ND, BS + Extremities:No obvious deformities, warm and dry Neuro: Answers questions when stimulated, MAE x 4, Alert to self and family GU: No assessed  Resolved Hospital Problem list     Assessment & Plan:  Acute on Chronic Respiratory Failure 2/2 known  OSA/OHS and medical non-compliance with CPAP Protecting airway History of Polysubstance abuse Hypercapnia per VBG in ED CXR , reviewed by me, with low lung volumes, cardiomegaly, atelectasis Plan: Will need SDU BiPAP at current settings Trend ABG prn  Sat goals are > 92% CXR prn Needs to work on weight loss Will need BiPAP on discharge, with follow up Pulmonary ( Dr. Vassie Loll) He will need reinforcement of need to wear his BiPAP every night without fail.  He has been unable to get new BiPAP due to financial balance with DME that [place] Noe has not addressed  Acute encephalopathy 2/2 hypercarbia which is 2/2 non-compliance with BiPAP therapy. Plan Monitor mental status per unit protocol ABG prn Urine drug screen Tox screen No sedation CT head    All  other care per Primary team  Disposition / Summary of Today's Plan 09/01/18   SDU on BiPAP     Diet: NPO Pain/Anxiety/Delirium protocol (if indicated): No sedation DVT prophylaxis: per priimary GI prophylaxis: per primary Hyperglycemia protocol: per primary Mobility: BR/ OOB to chair Code Status: Full Family Communication: Updated at bedside  Labs   CBC: Recent Labs  Lab 09/01/18 1337 09/01/18 1350  WBC 6.1  --   NEUTROABS 4.6  --   HGB 15.0 17.0  HCT 48.7 50.0  MCV 93.3  --   PLT 340  --     Basic Metabolic  Panel: Recent Labs  Lab 09/01/18 1337 09/01/18 1350  NA 138 141  K 4.0 3.9  CL 106 102  CO2 23  --   GLUCOSE 146* 147*  BUN 6 7  CREATININE 1.09 1.20  CALCIUM 8.7*  --    GFR: CrCl cannot be calculated (Unknown ideal weight.). Recent Labs  Lab 09/01/18 1337  WBC 6.1    Liver Function Tests: Recent Labs  Lab 09/01/18 1337  AST 19  ALT 16  ALKPHOS 50  BILITOT 0.4  PROT 7.9  ALBUMIN 3.9   No results for input(s): LIPASE, AMYLASE in the last 168 hours. No results for input(s): AMMONIA in the last 168 hours.  ABG    Component Value Date/Time   PHART 7.374 03/08/2018 2205   PCO2ART 49.4 (H) 03/08/2018 2205   PO2ART 88.5 03/08/2018 2205   HCO3 27.0 09/01/2018 1348   TCO2 28 09/01/2018 1350   ACIDBASEDEF 3.0 (H) 09/01/2018 1348   O2SAT 58.0 09/01/2018 1348     Coagulation Profile: No results for input(s): INR, PROTIME in the last 168 hours.  Cardiac Enzymes: No results for input(s): CKTOTAL, CKMB, CKMBINDEX, TROPONINI in the last 168 hours.  HbA1C: No results found for: HGBA1C  CBG: Recent Labs  Lab 09/01/18 1334  GLUCAP 133*    Admitting History of Present Illness.   37 year old male current every day smoker  with known polysubstance abuse ( Cocaine) and  narcolepsy and  OSA/OHS non-compliant with BiPAP at home. Seen at the pulmonary office He was found sitting in a wet parking lot at [facility],  "Unresponsive".  VBG on arrival to ED with pH of 7.19, PCO2 of 70. Per family nothing unusual in his health today. They stated he did go and play cards last night and when he stays up late and does not get sleep, this happens. He does not work. He does not drive. PCCM were asked to consult for OSA/ OHS.   Review of Systems:   Unable  Past Medical History  He,  has a past medical history of Narcolepsy, Obesity, and Sleep apnea.   Surgical History    Past Surgical History:  Procedure Laterality Date  . MULTIPLE EXTRACTIONS WITH ALVEOLOPLASTY N/A 07/24/2017    Procedure: attempted MULTIPLE EXTRACTION WITH ALVEOLOPLASTY;  Surgeon: Ocie Doyne, DDS;  Location: [facility] OR;  Service: Oral Surgery;  Laterality: N/A;  . NO PAST SURGERIES       Social History   Social History   Socioeconomic History  . Marital status: Single    Spouse name: Not on file  . Number of children: Not on file  . Years of education: Not on file  . Highest education level: Not on file  Occupational History  . Not on file  Social Needs  . Financial resource strain: Not on file  . Food insecurity:    Worry: Not on  file    Inability: Not on file  . Transportation needs:    Medical: Not on file    Non-medical: Not on file  Tobacco Use  . Smoking status: Current Some Day Smoker    Packs/day: 0.50    Types: Cigarettes  . Smokeless tobacco: Never Used  Substance and Sexual Activity  . Alcohol use: Not Currently    Alcohol/week: 0.0 standard drinks  . Drug use: Not Currently    Types: Cocaine    Comment: wife states no longer using cocaine for a couple of months as of 07/23/17  . Sexual activity: Not on file  Lifestyle  . Physical activity:    Days per week: Not on file    Minutes per session: Not on file  . Stress: Not on file  Relationships  . Social connections:    Talks on phone: Not on file    Gets together: Not on file    Attends religious service: Not on file    Active member of club or organization: Not on file    Attends meetings of clubs or organizations: Not on file    Relationship status: Not on file  . Intimate partner violence:    Fear of current or ex partner: Not on file    Emotionally abused: Not on file    Physically abused: Not on file    Forced sexual activity: Not on file  Other Topics Concern  . Not on file  Social History Narrative  . Not on file  ,  reports that he has been smoking cigarettes. He has been smoking about 0.50 packs per day. He has never used smokeless tobacco. He reports that he drank alcohol. He reports that he has  current or past drug history. Drug: Cocaine.   Family History   His family history includes Hypertension in his other.   Allergies Allergies  Allergen Reactions  . Hydrocodone-Acetaminophen Anaphylaxis  . Shrimp [Shellfish Allergy] Swelling and Other (See Comments)    Throat swells     Home Medications  Prior to Admission medications   Medication Sig Start Date End Date Taking? Authorizing Provider  naproxen (NAPROSYN) 500 MG tablet Take 1 tablet (500 mg total) by mouth 2 (two) times daily as needed. 03/08/18   Ward, Chase Picket, PA-C     Critical care time: 40 minutes    Bevelyn Ngo, AGACNP-BC [facility] Pulmonary/Critical Care Medicine Pager # [phone] 09/01/2018 3:16 PM  Attending Note:  37 year old male known polysubstance abuser with OSA/OHV, noncompliant with OSA and narcolepsy noncompliant with medications who was found unresponsive this AM in his house after a late night out last night.  Patient was brought to the ER where he was noted to be lethargic, refused ABG, VBG with a pH of 7.19 and patient was started on BiPAP.  MS improved some and PCCM was asked to admit to the ICU.  Patient was interviewed, he is lethargic but able to protect his airway with clear lungs but diminished BS on exam.  I reviewed CXR myself, low volumes but no active disease noted.  Discussed with PCCM-NP.  Acute on chronic hypercarbic respiratory failure:             - BiPAP             - Avoid intubation at all cost, if intubated, can not imagine this resolving without tracheostomy             - Adjust BiPAP for Tv  and drop FiO2 to 21%  Hypoxemia:             - Titrate O2 for sat of 88-92%  OSA:             - Encouraged CPAP at night             - Needs f/u in the office for a new CPAP machine and perhaps a more fitting mask  Polysubstance abuse:             - Drug cessation specially with current condition of his cardiopulmonary system  Non-compliance: he is lethargic at  this time to be able to advise but will need a discussion about what his goals are at this point.   Ok to admit to SDU and to [facility] service with PCCM following as consult.  Patient seen and examined, agree with above note.  I dictated the care and orders written for this patient under my direction.  Alyson Reedy, MD [phone]  Update:  After adjustment of BiPAP, patient woke up and refused admission.  ED staff interacted with patient.  He is signing out AMA.  No appointment made at this time.  PCCM will sign off.  Alyson Reedy, M.D. [facility] Pulmonary/Critical Care Medicine. Pager: [phone]. After hours pager: [phone].
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
## 2018-09-01 NOTE — ED Notes (Signed)
Pt ambulated to the restroom and back without assistance.  Pt signed AMA for to be discharged from the hospital against medical advice.  Pt leaving with family and all belongings

## 2018-09-01 NOTE — H&P (Signed)
History and Physical    DEARIUS HOFFMANN ZOX:[id number] DOB: 1981/08/25 DOA: 09/01/2018  PCP: Patient, No Pcp Per  Patient coming from:  Home  Chief Complaint: AMS  HPI: MAZIN EMMA is a 37 y.o. male with medical history significant of OSA, obesity, and narcolepsy presenting with AMS.  At the time that I went to evaluate the patient, he was standing up washing his hands and preparing to leave the hospital AMA.  ED Course:  Feeling better, might sign out AMA.  Found outside the [facility], somnolent, difficult to arouse.  2mg  Narcan didn't help.  Waking up on arrival.  Between his wife, girlfriend, and aunt - he calmed down.  VBG concerning - hypercarbia.  BIPAP x 30-40 minutes.  PCCM - ok for observation in SDU.  PMH, PSH, FH reviewed in Epic  Past Medical History:  Diagnosis Date  . Narcolepsy   . Obesity   . Sleep apnea    does use cpap    Past Surgical History:  Procedure Laterality Date  . MULTIPLE EXTRACTIONS WITH ALVEOLOPLASTY N/A 07/24/2017   Procedure: attempted MULTIPLE EXTRACTION WITH ALVEOLOPLASTY;  Surgeon: Ocie Doyne, DDS;  Location: [facility] OR;  Service: Oral Surgery;  Laterality: N/A;  . NO PAST SURGERIES      Social History   Socioeconomic History  . Marital status: Single    Spouse name: Not on file  . Number of children: Not on file  . Years of education: Not on file  . Highest education level: Not on file  Occupational History  . Not on file  Social Needs  . Financial resource strain: Not on file  . Food insecurity:    Worry: Not on file    Inability: Not on file  . Transportation needs:    Medical: Not on file    Non-medical: Not on file  Tobacco Use  . Smoking status: Current Some Day Smoker    Packs/day: 0.50    Types: Cigarettes  . Smokeless tobacco: Never Used  Substance and Sexual Activity  . Alcohol use: Not Currently    Alcohol/week: 0.0 standard drinks  . Drug use: Not Currently    Types: Cocaine    Comment: wife states no longer  using cocaine for a couple of months as of 07/23/17  . Sexual activity: Not on file  Lifestyle  . Physical activity:    Days per week: Not on file    Minutes per session: Not on file  . Stress: Not on file  Relationships  . Social connections:    Talks on phone: Not on file    Gets together: Not on file    Attends religious service: Not on file    Active member of club or organization: Not on file    Attends meetings of clubs or organizations: Not on file    Relationship status: Not on file  . Intimate partner violence:    Fear of current or ex partner: Not on file    Emotionally abused: Not on file    Physically abused: Not on file    Forced sexual activity: Not on file  Other Topics Concern  . Not on file  Social History Narrative  . Not on file    Allergies  Allergen Reactions  . Hydrocodone-Acetaminophen Anaphylaxis  . Shrimp [Shellfish Allergy] Swelling and Other (See Comments)    Throat swells    Family History  Problem Relation Age of Onset  . Hypertension Other     Prior to  Admission medications   Medication Sig Start Date End Date Taking? Authorizing Provider  naproxen (NAPROSYN) 500 MG tablet Take 1 tablet (500 mg total) by mouth 2 (two) times daily as needed. 03/08/18   Ward, Chase Picket, PA-C    Physical Exam: Vitals:   09/01/18 1336 09/01/18 1345 09/01/18 1456 09/01/18 1500  BP: (!) 160/142 (!) 181/161  (!) 138/126  Pulse: (!) 106 97 84 95  Resp: 19 18 14 20   SpO2: 94% 93% 100% 100%     General:  Appears calm and comfortable and is NAD; standing at the sink without assistance Eyes:  EOMI, normal lids, iris  Additional exam unable to be performed as the patient is leaving AMA  Radiological Exams on Admission: Ct Head Wo Contrast  Result Date: 09/01/2018 CLINICAL DATA:  Altered mental status with unresponsiveness EXAM: CT HEAD WITHOUT CONTRAST TECHNIQUE: Contiguous axial images were obtained from the base of the skull through the vertex without  intravenous contrast. COMPARISON:  July 08, 2018 FINDINGS: Brain: The ventricles are normal in size and configuration. There is no intracranial mass, hemorrhage, extra-axial fluid collection, or midline shift. The brain parenchyma appears unremarkable. No acute infarct evident. Vascular: No hyperdense vessel. There is no appreciable vascular calcification. Skull: The bony calvarium appears intact. Sinuses/Orbits: There is opacification in several ethmoid air cells. There are retention cysts, incompletely visualized, in each inferior maxillary antrum. Frontal sinuses are essentially aplastic. Orbits appear symmetric bilaterally. Other: Mastoid air cells are clear. IMPRESSION: Areas of paranasal sinus disease.  Study otherwise unremarkable. Electronically Signed   By: Bretta Bang III M.D.   On: 09/01/2018 14:44   Dg Chest Port 1 View  Result Date: 09/01/2018 CLINICAL DATA:  Dyspnea EXAM: PORTABLE CHEST 1 VIEW COMPARISON:  03/08/2018 FINDINGS: Low lung volumes. The patient's chin and neck obscure the apices. Mild cardiomegaly. Minimal basilar atelectasis. No pleural effusion or focal airspace disease. IMPRESSION: No active disease. Low lung volumes with cardiomegaly and mild basilar atelectasis Electronically Signed   By: Jasmine Pang M.D.   On: 09/01/2018 14:06    EKG: Independently reviewed.  Sinus tachycardia with rate 108; nonspecific ST changes with no evidence of acute ischemia   Labs on Admission: I have personally reviewed the available labs and imaging studies at the time of the admission.  Pertinent labs:   VBG: 7.194/70.1 Glucose 146 Troponin 0.01 Normal CBC HCG negative  Assessment/Plan Active Problems:   Acute encephalopathy   Acute on chronic respiratory failure (HCC)   -Patient presenting with acute encephalopathy resulting from hypercarbic respiratory failure -He was placed on BIPAP with good result -As such, he was feeling better enough to sign out AMA -PCCM was  consulted and requested TR[facility] admission -I was not otherwise available to evaluate the patient    Jonah Blue MD [facility]  If note is complete, please contact covering daytime or nighttime physician. [email or web address] Password [facility]  09/01/2018, 3:39 PM

## 2018-09-01 NOTE — ED Provider Notes (Signed)
[facility] EMERGENCY DEPARTMENT Provider Note   CSN: [id number] Arrival date & time: 09/01/18  1326     History   Chief Complaint Chief Complaint  Patient presents with  . Altered Mental Status    HPI Darryl Moody is a 37 y.o. male.  37 year old male with prior medical history as detailed below presents for evaluation of altered mental status.  Patient was found outside the [facility].  Apparently patient was waiting for family members to finish business in the [facility] when he was found to be somnolent and difficult to arouse.  Per history obtained from the patient's girlfriend, the patient is "always like this."  The patient's girlfriend reports that he has a long-standing history of narcolepsy and will be difficult to arouse frequently.  Additional history is obtained from patient's wife upon her arrival.  Patient with a prior history of hypercarbic respiratory failure.  Patient has had prior intubations in the past.  Patient has a CPAP at home but he is noncompliant with same.  Patient arrived with a EMS.  Initially he was somnolent but arousable.  He refused intervention from the ED.  He was initially refusing care and intervention.  After he was able to talk with his family and make a phone call he consented to BiPAP and additional interventions.  The patient was protecting his airway.  He was arousable with verbal stimulation.  The history is provided by the patient, the EMS personnel, medical records, a relative and a friend.  Altered Mental Status   This is a recurrent problem. The current episode started 1 to 2 hours ago. The problem has been gradually improving. Associated symptoms include somnolence and unresponsiveness.    Past Medical History:  Diagnosis Date  . Narcolepsy   . Obesity   . Sleep apnea    does use cpap    Patient Active Problem List   Diagnosis Date Noted  . Narcolepsy 08/10/2015  . Acute encephalopathy 07/27/2015  . Respiratory  failure (HCC) 07/27/2015  . Acute on chronic respiratory failure (HCC)   . Obstructive sleep apnea     Past Surgical History:  Procedure Laterality Date  . MULTIPLE EXTRACTIONS WITH ALVEOLOPLASTY N/A 07/24/2017   Procedure: attempted MULTIPLE EXTRACTION WITH ALVEOLOPLASTY;  Surgeon: Ocie Doyne, DDS;  Location: [facility] OR;  Service: Oral Surgery;  Laterality: N/A;  . NO PAST SURGERIES          Home Medications    Prior to Admission medications   Medication Sig Start Date End Date Taking? Authorizing Provider  naproxen (NAPROSYN) 500 MG tablet Take 1 tablet (500 mg total) by mouth 2 (two) times daily as needed. 03/08/18   Ward, Chase Picket, PA-C    Family History Family History  Problem Relation Age of Onset  . Hypertension Other     Social History Social History   Tobacco Use  . Smoking status: Current Some Day Smoker    Packs/day: 0.50    Types: Cigarettes  . Smokeless tobacco: Never Used  Substance Use Topics  . Alcohol use: Not Currently    Alcohol/week: 0.0 standard drinks  . Drug use: Not Currently    Types: Cocaine    Comment: wife states no longer using cocaine for a couple of months as of 07/23/17     Allergies   Hydrocodone-acetaminophen and Shrimp [shellfish allergy]   Review of Systems Review of Systems  All other systems reviewed and are negative.    Physical Exam Updated Vital Signs BP Marland Kitchen)  181/161   Pulse 95   Resp 20   SpO2 100%   Physical Exam  Constitutional: He appears well-developed and well-nourished. No distress.  HENT:  Head: Normocephalic and atraumatic.  Mouth/Throat: Oropharynx is clear and moist.  Eyes: Pupils are equal, round, and reactive to light. Conjunctivae and EOM are normal.  Neck: Normal range of motion. Neck supple.  Cardiovascular: Normal rate, regular rhythm and normal heart sounds.  Pulmonary/Chest: Effort normal and breath sounds normal. No respiratory distress.  Abdominal: Soft. He exhibits no distension. There  is no tenderness.  Musculoskeletal: Normal range of motion. He exhibits no edema or deformity.  Neurological: He is alert.  Somnolent but arousable with verbal stimulation.  Uncooperative with attempts to obtain history and with interventions.  Skin: Skin is warm and dry.  Psychiatric: He has a normal mood and affect.  Nursing note and vitals reviewed.    ED Treatments / Results  Labs (all labs ordered are listed, but only abnormal results are displayed) Labs Reviewed  I-STAT CHEM 8, ED - Abnormal; Notable for the following components:      Result Value   Glucose, Bld 147 (*)    All other components within normal limits  I-STAT VENOUS BLOOD GAS, ED - Abnormal; Notable for the following components:   pH, Ven 7.194 (*)    pCO2, Ven 70.1 (*)    Acid-base deficit 3.0 (*)    All other components within normal limits  CBG MONITORING, ED - Abnormal; Notable for the following components:   Glucose-Capillary 133 (*)    All other components within normal limits  CBC WITH DIFFERENTIAL/PLATELET  RAPID URINE DRUG SCREEN, HOSP PERFORMED  ETHANOL  COMPREHENSIVE METABOLIC PANEL  I-STAT TROPONIN, ED  I-STAT BETA HCG BLOOD, ED ([facility], [facility], [facility] ONLY)  I-STAT ARTERIAL BLOOD GAS, ED    EKG EKG Interpretation  Date/Time:  Tuesday September 01 2018 13:34:45 EDT Ventricular Rate:  108 PR Interval:    QRS Duration: 76 QT Interval:  354 QTC Calculation: 475 R Axis:   53 Text Interpretation:  Sinus tachycardia Ventricular premature complex Probable left atrial enlargement Abnormal R-wave progression, early transition Confirmed by Kristine Royal [phone]) on 09/01/2018 1:53:49 PM   Radiology Ct Head Wo Contrast  Result Date: 09/01/2018 CLINICAL DATA:  Altered mental status with unresponsiveness EXAM: CT HEAD WITHOUT CONTRAST TECHNIQUE: Contiguous axial images were obtained from the base of the skull through the vertex without intravenous contrast. COMPARISON:  July 08, 2018 FINDINGS: Brain: The  ventricles are normal in size and configuration. There is no intracranial mass, hemorrhage, extra-axial fluid collection, or midline shift. The brain parenchyma appears unremarkable. No acute infarct evident. Vascular: No hyperdense vessel. There is no appreciable vascular calcification. Skull: The bony calvarium appears intact. Sinuses/Orbits: There is opacification in several ethmoid air cells. There are retention cysts, incompletely visualized, in each inferior maxillary antrum. Frontal sinuses are essentially aplastic. Orbits appear symmetric bilaterally. Other: Mastoid air cells are clear. IMPRESSION: Areas of paranasal sinus disease.  Study otherwise unremarkable. Electronically Signed   By: Bretta Bang III M.D.   On: 09/01/2018 14:44   Dg Chest Port 1 View  Result Date: 09/01/2018 CLINICAL DATA:  Dyspnea EXAM: PORTABLE CHEST 1 VIEW COMPARISON:  03/08/2018 FINDINGS: Low lung volumes. The patient's chin and neck obscure the apices. Mild cardiomegaly. Minimal basilar atelectasis. No pleural effusion or focal airspace disease. IMPRESSION: No active disease. Low lung volumes with cardiomegaly and mild basilar atelectasis Electronically Signed   By: Jasmine Pang  M.D.   On: 09/01/2018 14:06    Procedures Procedures (including critical care time) CRITICAL CARE Performed by: Wynetta Fines   Total critical care time: 45 minutes  Critical care time was exclusive of separately billable procedures and treating other patients.  Critical care was necessary to treat or prevent imminent or life-threatening deterioration.  Critical care was time spent personally by me on the following activities: development of treatment plan with patient and/or surrogate as well as nursing, discussions with consultants, evaluation of patient's response to treatment, examination of patient, obtaining history from patient or surrogate, ordering and performing treatments and interventions, ordering and review of  laboratory studies, ordering and review of radiographic studies, pulse oximetry and re-evaluation of patient's condition.   Medications Ordered in ED Medications - No data to display   Initial Impression / Assessment and Plan / ED Course  I have reviewed the triage vital signs and the nursing notes.  Pertinent labs & imaging results that were available during my care of the patient were reviewed by me and considered in my medical decision making (see chart for details).     MDM  Screen complete  Patient with likely AMS secondary to hypercarbia.  Patient has finally consented to placement of BiPAP.  Screening labs obtained thus far do not suggest other acute pathology.   Critical care services are aware of the case.  They will consult.  They request that the hospital service see the patient for admission.  Hospitalist service Ophelia Charter) is aware of case and will evaluate for admission. Ophelia Charter is aware that patient will most likely sign out AMA - despite this provider attempting to convince him to stay.   1520 Patient is now refusing care - He is refusing admission - He is signing out AMA - He is competent at this time.  He understands that refusing medical care could result in significant illness and/or death.  Patient is surrounded by friends and family during the [facility] discussion.  Final Clinical Impressions(s) / ED Diagnoses   Final diagnoses:  Altered mental status, unspecified altered mental status type  Hypercarbia    ED Discharge Orders    None       Wynetta Fines, MD 09/01/18 1530

## 2018-09-01 NOTE — ED Notes (Signed)
BIPAP Placed on pt, tolerating at this time.

## 2018-09-01 NOTE — ED Notes (Signed)
Family at bedside. 

## 2018-09-01 NOTE — ED Notes (Signed)
ED Provider at bedside. 

## 2018-09-01 NOTE — ED Notes (Signed)
Pt is alert and awake at this time, not being very cooperative- refusing to go to CT- taking cardiac monitor off. Pt is currently on the phone and states he needs a minute.

## 2018-12-11 ENCOUNTER — Emergency Department (HOSPITAL_COMMUNITY): Payer: Medicaid Other

## 2018-12-11 ENCOUNTER — Other Ambulatory Visit: Payer: Self-pay

## 2018-12-11 ENCOUNTER — Encounter (HOSPITAL_COMMUNITY): Payer: Self-pay | Admitting: Emergency Medicine

## 2018-12-11 ENCOUNTER — Emergency Department (HOSPITAL_COMMUNITY)
Admission: EM | Admit: 2018-12-11 | Discharge: 2018-12-11 | Discharge status: Against Medical Advice | Payer: Medicaid Other | Attending: Emergency Medicine | Admitting: Emergency Medicine

## 2018-12-11 ENCOUNTER — Ambulatory Visit (HOSPITAL_COMMUNITY)
Admission: EM | Admit: 2018-12-11 | Discharge: 2018-12-11 | Disposition: A | Discharge status: Home / Self Care | Payer: Medicaid Other

## 2018-12-11 DIAGNOSIS — R4182 Altered mental status, unspecified: Secondary | ICD-10-CM

## 2018-12-11 DIAGNOSIS — E662 Morbid (severe) obesity with alveolar hypoventilation: Secondary | ICD-10-CM | POA: Diagnosis not present

## 2018-12-11 DIAGNOSIS — R202 Paresthesia of skin: Secondary | ICD-10-CM | POA: Diagnosis not present

## 2018-12-11 DIAGNOSIS — M79601 Pain in right arm: Secondary | ICD-10-CM

## 2018-12-11 DIAGNOSIS — F17210 Nicotine dependence, cigarettes, uncomplicated: Secondary | ICD-10-CM | POA: Insufficient documentation

## 2018-12-11 DIAGNOSIS — R200 Anesthesia of skin: Secondary | ICD-10-CM | POA: Diagnosis present

## 2018-12-11 DIAGNOSIS — M79602 Pain in left arm: Secondary | ICD-10-CM

## 2018-12-11 LAB — CBC WITH DIFFERENTIAL/PLATELET
Abs Immature Granulocytes: 0.01 10*3/uL (ref 0.00–0.07)
Basophils Absolute: 0 10*3/uL (ref 0.0–0.1)
Basophils Relative: 1 %
Eosinophils Absolute: 0.2 10*3/uL (ref 0.0–0.5)
Eosinophils Relative: 3 %
HCT: 46.7 % (ref 39.0–52.0)
HEMOGLOBIN: 14.8 g/dL (ref 13.0–17.0)
Immature Granulocytes: 0 %
LYMPHS ABS: 1.4 10*3/uL (ref 0.7–4.0)
LYMPHS PCT: 28 %
MCH: 28.6 pg (ref 26.0–34.0)
MCHC: 31.7 g/dL (ref 30.0–36.0)
MCV: 90.3 fL (ref 80.0–100.0)
MONO ABS: 0.7 10*3/uL (ref 0.1–1.0)
MONOS PCT: 14 %
Neutro Abs: 2.8 10*3/uL (ref 1.7–7.7)
Neutrophils Relative %: 54 %
Platelets: 317 10*3/uL (ref 150–400)
RBC: 5.17 MIL/uL (ref 4.22–5.81)
RDW: 13.2 % (ref 11.5–15.5)
WBC: 5.1 10*3/uL (ref 4.0–10.5)
nRBC: 0 % (ref 0.0–0.2)

## 2018-12-11 LAB — POCT I-STAT EG7
Acid-Base Excess: 3 mmol/L — ABNORMAL HIGH (ref 0.0–2.0)
Bicarbonate: 31.7 mmol/L — ABNORMAL HIGH (ref 20.0–28.0)
Calcium, Ion: 1.26 mmol/L (ref 1.15–1.40)
HCT: 52 % (ref 39.0–52.0)
HEMOGLOBIN: 17.7 g/dL — AB (ref 13.0–17.0)
O2 Saturation: 66 %
PCO2 VEN: 65.1 mmHg — AB (ref 44.0–60.0)
Patient temperature: 37
Potassium: 4.4 mmol/L (ref 3.5–5.1)
Sodium: 140 mmol/L (ref 135–145)
TCO2: 34 mmol/L — ABNORMAL HIGH (ref 22–32)
pH, Ven: 7.296 (ref 7.250–7.430)
pO2, Ven: 39 mmHg (ref 32.0–45.0)

## 2018-12-11 LAB — BASIC METABOLIC PANEL
Anion gap: 9 (ref 5–15)
BUN: 10 mg/dL (ref 6–20)
CHLORIDE: 106 mmol/L (ref 98–111)
CO2: 25 mmol/L (ref 22–32)
Calcium: 8.8 mg/dL — ABNORMAL LOW (ref 8.9–10.3)
Creatinine, Ser: 0.87 mg/dL (ref 0.61–1.24)
GFR calc Af Amer: >60 mL/min (ref >60)
GFR calc non Af Amer: >60 mL/min (ref >60)
GLUCOSE: 101 mg/dL — AB (ref 70–99)
Potassium: 4.7 mmol/L (ref 3.5–5.1)
Sodium: 140 mmol/L (ref 135–145)

## 2018-12-11 LAB — AMMONIA: Ammonia: 54 umol/L — ABNORMAL HIGH (ref 9–35)

## 2018-12-11 LAB — BRAIN NATRIURETIC PEPTIDE: B Natriuretic Peptide: 13.1 pg/mL (ref 0.0–100.0)

## 2018-12-11 LAB — TROPONIN I: Troponin I: <0.03 ng/mL (ref <0.03)

## 2018-12-11 MED ORDER — NALOXONE HCL 2 MG/2ML IJ SOSY
PREFILLED_SYRINGE | INTRAMUSCULAR | Status: AC
  Filled 2018-12-11: qty 2

## 2018-12-11 MED ORDER — IBUPROFEN 800 MG PO TABS: 800.0000 mg | ORAL_TABLET | Freq: Once | ORAL | Status: DC

## 2018-12-11 NOTE — ED Notes (Signed)
Pt in room eating handfuls of cheezits shortly after NPO education. Pt able to wake up long enough to eat entire bag of cheezits and then fell asleep shortly thereafter. Educated pt again to dangers of eating and using BiPAP or requiring ventilation support. Significant other became agitated and began to curse at this RN. RN exited room. PA Harris made aware.

## 2018-12-11 NOTE — ED Notes (Signed)
Significant other at bedside attempting to feed lethargic and borderline obtunded pt cheez its while he is sleeping. Explained to significant other the danger of doing this. Explained we are likely placing him on bipap, and the dangers of placing food in mouth before or during use of bipap. Family member voiced understanding.

## 2018-12-11 NOTE — Care Management (Addendum)
ED CM consulted patient reported his Bipap machine at home is missing the power cord. Explained to patient that he can use any universal three prong adapter cord he has at home. Patient verbalized understanding. No further ED CM needs identified.  Updated [employer] on [place].

## 2018-12-11 NOTE — ED Provider Notes (Signed)
Medical screening examination/treatment/procedure(s) were conducted as a shared visit with non-physician practitioner(s) and myself.  I personally evaluated the patient during the encounter.  On initial arrival patient is sleepy and slightly hypoxic. pupils equal. Tachypneic. Diminished breath sounds.  Likely hypoventilating and hypercarbic. Plan for bipap and observation with normal workup.   CRITICAL CARE Performed by: Marily Memos Total critical care time: 35 minutes Critical care time was exclusive of separately billable procedures and treating other patients. Critical care was necessary to treat or prevent imminent or life-threatening deterioration. Critical care was time spent personally by me on the following activities: development of treatment plan with patient and/or surrogate as well as nursing, discussions with consultants, evaluation of patient's response to treatment, examination of patient, obtaining history from patient or surrogate, ordering and performing treatments and interventions, ordering and review of laboratory studies, ordering and review of radiographic studies, pulse oximetry and re-evaluation of patient's condition.   EKG Interpretation  Date/Time:  Friday December 11 2018 20:04:04 EST Ventricular Rate:  73 PR Interval:    QRS Duration: 85 QT Interval:  393 QTC Calculation: 433 R Axis:   36 Text Interpretation:  Sinus rhythm Baseline wander in lead(s) V6 No significant change since last tracing Confirmed by Marily Memos [phone]) on 12/11/2018 11:07:48 PM      Salik Grewell, Barbara Cower, MD 12/12/18 0030

## 2018-12-11 NOTE — ED Notes (Signed)
Small child at bedside who does not appear older than 12. This RN asked pt how old he was, pt stated 11 and giggled. RN asked pt what year he was born, pt son giggled again and stated I don't know my birthday. Explained we were trying to ensure that pt was older than 12 to maintain safety for both him and other patients d/t flu restriction. Pt stated "what's the f*&^% problem? Don't harass my children". Explained again to pt re: flu restrictions. At this time, received call from security that mother had left two small children in the lobby unattended d/t flu restrictions. Advised pt that children were not to be left unattended and pt advised this RN that s/o was "going to check on that".

## 2018-12-11 NOTE — ED Provider Notes (Signed)
[facility] EMERGENCY DEPARTMENT Provider Note   CSN: [id number] Arrival date & time: 12/11/18  1842     History   Chief Complaint No chief complaint on file.   HPI Darryl Moody is a 38 y.o. male with past medical history of narcolepsy, polysubstance abuse, hypoxemia, acute encephalopathy, respiratory failure, sleep apnea. He was sent to the ER for evaluation of Numbness in his hand. Upon arrival the patient was extremely lethargic and only arousable to pain with drooling, sonorous respirations. His girlfriend states that this is consistent with his "narcolepsy."  She states that he also is supposed to use his Bipap, but they do not have a cord to plug it in. There is a level 5 caveat due to AMS. The patient's girlfriend and son state that he has been c/o numbness and tingling in his hands.   HPI  Past Medical History:  Diagnosis Date  . Narcolepsy   . Obesity   . Sleep apnea    does use cpap    Patient Active Problem List   Diagnosis Date Noted  . Polysubstance abuse (HCC)   . Hypoxemia   . Narcolepsy 08/10/2015  . Acute encephalopathy 07/27/2015  . Respiratory failure (HCC) 07/27/2015  . Acute on chronic respiratory failure (HCC)   . Obstructive sleep apnea     Past Surgical History:  Procedure Laterality Date  . MULTIPLE EXTRACTIONS WITH ALVEOLOPLASTY N/A 07/24/2017   Procedure: attempted MULTIPLE EXTRACTION WITH ALVEOLOPLASTY;  Surgeon: Ocie Doyne, DDS;  Location: [facility] OR;  Service: Oral Surgery;  Laterality: N/A;  . NO PAST SURGERIES          Home Medications    Prior to Admission medications   Medication Sig Start Date End Date Taking? Authorizing Provider  naproxen (NAPROSYN) 500 MG tablet Take 1 tablet (500 mg total) by mouth 2 (two) times daily as needed. 03/08/18   Ward, Chase Picket, PA-C    Family History Family History  Problem Relation Age of Onset  . Hypertension Other     Social History Social History   Tobacco Use    . Smoking status: Current Some Day Smoker    Packs/day: 0.50    Types: Cigarettes  . Smokeless tobacco: Never Used  Substance Use Topics  . Alcohol use: Not Currently    Alcohol/week: 0.0 standard drinks  . Drug use: Not Currently    Types: Cocaine    Comment: wife states no longer using cocaine for a couple of months as of 07/23/17     Allergies   Hydrocodone-acetaminophen and Shrimp [shellfish allergy]   Review of Systems Review of Systems Unable to review systems due to AMS  Physical Exam Updated Vital Signs BP (!) 156/90   Pulse 74   Resp 16   SpO2 100%   Physical Exam Nursing note reviewed.  Constitutional:      Appearance: He is obese.     Comments: Morbidly obese, pickwickian   HENT:     Head: Normocephalic and atraumatic.     Nose: Nose normal.     Mouth/Throat:     Mouth: Mucous membranes are moist.     Comments: drooling Eyes:     Extraocular Movements: Extraocular movements intact.     Pupils: Pupils are equal, round, and reactive to light.  Cardiovascular:     Rate and Rhythm: Normal rate and regular rhythm.  Pulmonary:     Comments: Sonorous respirations Abdominal:     Comments: Obese/protuberant abdomen  Musculoskeletal: Normal  range of motion.  Skin:    General: Skin is warm and dry.  Neurological:     GCS: GCS eye subscore is 2. GCS verbal subscore is 3. GCS motor subscore is 6.      ED Treatments / Results  Labs (all labs ordered are listed, but only abnormal results are displayed) Labs Reviewed  BASIC METABOLIC PANEL - Abnormal; Notable for the following components:      Result Value   Glucose, Bld 101 (*)    Calcium 8.8 (*)    All other components within normal limits  AMMONIA - Abnormal; Notable for the following components:   Ammonia 54 (*)    All other components within normal limits  POCT I-STAT EG7 - Abnormal; Notable for the following components:   pCO2, Ven 65.1 (*)    Bicarbonate 31.7 (*)    TCO2 34 (*)    Acid-Base  Excess 3.0 (*)    Hemoglobin 17.7 (*)    All other components within normal limits  CBC WITH DIFFERENTIAL/PLATELET  BRAIN NATRIURETIC PEPTIDE  BLOOD GAS, VENOUS  RAPID URINE DRUG SCREEN, HOSP PERFORMED  TROPONIN I  I-STAT TROPONIN, ED  I-STAT VENOUS BLOOD GAS, ED    EKG None  Radiology Dg Chest Port 1 View  Result Date: 12/11/2018 CLINICAL DATA:  Increased SOB today. Unable to wake up pt for imaging, no further history available EXAM: PORTABLE CHEST 1 VIEW COMPARISON:  09/01/2018 FINDINGS: Cardiac silhouette is enlarged.  No mediastinal or hilar masses. Lungs clear.  No convincing pleural effusion.  No pneumothorax. Skeletal structures are grossly intact. IMPRESSION: 1. No acute cardiopulmonary disease. 2. Mild cardiomegaly. Electronically Signed   By: Amie Portland M.D.   On: 12/11/2018 20:33    Procedures Procedures (including critical care time)  Medications Ordered in ED Medications  naloxone (NARCAN) 2 MG/2ML injection (has no administration in time range)     Initial Impression / Assessment and Plan / ED Course  I have reviewed the triage vital signs and the nursing notes.  Pertinent labs & imaging results that were available during my care of the patient were reviewed by me and considered in my medical decision making (see chart for details).  Clinical Course as of Dec 11 2246  Fri Dec 11, 2018  2240 Patient more alert. States that he has a bad headache. Also states that R hand is numb. Intermittent numbness of the left hand. No weakness. No other complaints.    [AH]    Clinical Course User Index [AH] Arthor Captain, PA-C    Patient eloped prior to resolution of work-up.  Is elevated and this is chronic.  He chronic hypercarbic respiratory failure.  Educated on use of his BiPAP machine.  He has been waiting for a court however he can use any three-pronged adaptive cord to plug his machine in which can be purchased at any store or taken off of his television.   Patient chest x-ray negative.  Final Clinical Impressions(s) / ED Diagnoses   Final diagnoses:  Obesity hypoventilation syndrome [facility])    ED Discharge Orders    None       Arthor Captain, PA-C 12/12/18 0029    Marily Memos, MD 12/12/18 0030

## 2018-12-11 NOTE — ED Triage Notes (Signed)
PT reports right arm numbness for a few days. PT reports numbness down to right hand. PT is narcoleptic and sleeping during triage.

## 2018-12-11 NOTE — ED Notes (Signed)
Pt eloped. Removed IV himself before leaving.

## 2018-12-11 NOTE — ED Notes (Signed)
Pt placed on 2 liters  sats up to 93  %

## 2018-12-11 NOTE — ED Provider Notes (Signed)
[facility]    CSN: [id number] Arrival date & time: 12/11/18  1606     History   Chief Complaint Chief Complaint  Patient presents with  . Numbness    HPI Darryl Moody is a 38 y.o. male.   Patient is a 38 year old male with past medical history of narcolepsy, polysubstance abuse, hypoxemia, acute encephalopathy, respiratory failure, sleep apnea.  He presents with significant other.  She is speaking for him reporting that he has been having bilateral right arm numbness for a few days that has been intermittent.  She reports that his hands have been hurting. No falls or injuries. She reports that he is much more lethargic than normal. She denies that he has taken any drugs. Pt very hard to arouse to assess and falling asleep during HPI.   ROS per HPI      Past Medical History:  Diagnosis Date  . Narcolepsy   . Obesity   . Sleep apnea    does use cpap    Patient Active Problem List   Diagnosis Date Noted  . Polysubstance abuse (HCC)   . Hypoxemia   . Narcolepsy 08/10/2015  . Acute encephalopathy 07/27/2015  . Respiratory failure (HCC) 07/27/2015  . Acute on chronic respiratory failure (HCC)   . Obstructive sleep apnea     Past Surgical History:  Procedure Laterality Date  . MULTIPLE EXTRACTIONS WITH ALVEOLOPLASTY N/A 07/24/2017   Procedure: attempted MULTIPLE EXTRACTION WITH ALVEOLOPLASTY;  Surgeon: Ocie Doyne, DDS;  Location: [facility];  Service: Oral Surgery;  Laterality: N/A;  . NO PAST SURGERIES         Home Medications    Prior to Admission medications   Medication Sig Start Date End Date Taking? Authorizing Provider  naproxen (NAPROSYN) 500 MG tablet Take 1 tablet (500 mg total) by mouth 2 (two) times daily as needed. 03/08/18   Ward, Chase Picket, PA-C    Family History Family History  Problem Relation Age of Onset  . Hypertension Other     Social History Social History   Tobacco Use  . Smoking status: Current Some Day Smoker      Packs/day: 0.50    Types: Cigarettes  . Smokeless tobacco: Never Used  Substance Use Topics  . Alcohol use: Not Currently    Alcohol/week: 0.0 standard drinks  . Drug use: Not Currently    Types: Cocaine    Comment: wife states no longer using cocaine for a couple of months as of 07/23/17     Allergies   Hydrocodone-acetaminophen and Shrimp [shellfish allergy]   Review of Systems Review of Systems   Physical Exam Triage Vital Signs ED Triage Vitals  Enc Vitals Group     BP 12/11/18 1744 (!) 162/94     Pulse Rate 12/11/18 1744 86     Resp 12/11/18 1744 16     Temp 12/11/18 1744 97.9 F (36.6 C)     Temp Source 12/11/18 1744 Temporal     SpO2 12/11/18 1744 98 %     Weight --      Height --      Head Circumference --      Peak Flow --      Pain Score 12/11/18 1743 4     Pain Loc --      Pain Edu? --      Excl. in GC? --    No data found.  Updated Vital Signs BP (!) 162/94   Pulse 86  Temp 97.9 F (36.6 C) (Temporal)   Resp 16   SpO2 98%   Visual Acuity Right Eye Distance:   Left Eye Distance:   Bilateral Distance:    Right Eye Near:   Left Eye Near:    Bilateral Near:     Physical Exam Vitals signs and nursing note reviewed.  Constitutional:      Comments: Pt snoring, very hard to arouse even with sternal rub.   HENT:     Head: Normocephalic and atraumatic.  Eyes:     Comments: Pupils pinpoint and slow to react. Mild scleral injection bilateral Left cornea hazy  Cardiovascular:     Rate and Rhythm: Normal rate and regular rhythm.     Heart sounds: Normal heart sounds.  Pulmonary:     Comments: Snoring respirations.  Skin:    General: Skin is warm and dry.  Neurological:     Comments: Unable to follow commands.       UC Treatments / Results  Labs (all labs ordered are listed, but only abnormal results are displayed) Labs Reviewed - No data to display  EKG None  Radiology No results found.  Procedures Procedures (including  critical care time)  Medications Ordered in UC Medications - No data to display  Initial Impression / Assessment and Plan / UC Course  I have reviewed the triage vital signs and the nursing notes.  Pertinent labs & imaging results that were available during my care of the patient were reviewed by me and considered in my medical decision making (see chart for details).     Sending pt to the ER for further evaluation I am unable to fully assess the pt due to altered state.  Not sure if this is normal for him or if he has worsened.  He was seen in the ER a few months ago and found  To be altered mental status due to hypercarbia.  He needs further evaluation than we can do in the urgent care. Significant other agrees to take him to the ER.   Final Clinical Impressions(s) / UC Diagnoses   Final diagnoses:  Paresthesias  Altered mental status, unspecified altered mental status type  Bilateral arm pain     Discharge Instructions     Please go to the ER for further evaluation.     ED Prescriptions    None     Controlled Substance Prescriptions [place] Controlled Substance Registry consulted? Not Applicable   Janace ArisBast, Lebron Nauert A, NP 12/11/18 1844

## 2018-12-11 NOTE — Progress Notes (Signed)
RT spoke with MD in regards to BIPAP. No BIPAP is needed at this time. MD stated she would call if anything changes.

## 2018-12-11 NOTE — ED Triage Notes (Signed)
Pt here from UC for bil arm numbness and pain going down both arms

## 2018-12-11 NOTE — Discharge Instructions (Addendum)
Please go to the ER for further evaluation °

## 2018-12-12 ENCOUNTER — Other Ambulatory Visit: Payer: Self-pay

## 2018-12-12 ENCOUNTER — Emergency Department (HOSPITAL_COMMUNITY)
Admission: EM | Admit: 2018-12-12 | Discharge: 2018-12-12 | Disposition: A | Discharge status: Home / Self Care | Payer: Medicaid Other | Attending: Emergency Medicine | Admitting: Emergency Medicine

## 2018-12-12 DIAGNOSIS — F17210 Nicotine dependence, cigarettes, uncomplicated: Secondary | ICD-10-CM | POA: Insufficient documentation

## 2018-12-12 DIAGNOSIS — M542 Cervicalgia: Secondary | ICD-10-CM | POA: Insufficient documentation

## 2018-12-12 DIAGNOSIS — R202 Paresthesia of skin: Secondary | ICD-10-CM | POA: Insufficient documentation

## 2018-12-12 DIAGNOSIS — G4730 Sleep apnea, unspecified: Secondary | ICD-10-CM | POA: Insufficient documentation

## 2018-12-12 DIAGNOSIS — Z720 Tobacco use: Secondary | ICD-10-CM

## 2018-12-12 DIAGNOSIS — R200 Anesthesia of skin: Secondary | ICD-10-CM | POA: Insufficient documentation

## 2018-12-12 LAB — POCT I-STAT 7, (LYTES, BLD GAS, ICA,H+H)
Bicarbonate: 27.6 mmol/L (ref 20.0–28.0)
Calcium, Ion: 1.26 mmol/L (ref 1.15–1.40)
HCT: 43 % (ref 39.0–52.0)
Hemoglobin: 14.6 g/dL (ref 13.0–17.0)
O2 Saturation: 88 %
PH ART: 7.315 — AB (ref 7.350–7.450)
POTASSIUM: 3.9 mmol/L (ref 3.5–5.1)
SODIUM: 140 mmol/L (ref 135–145)
TCO2: 29 mmol/L (ref 22–32)
pCO2 arterial: 54.3 mmHg — ABNORMAL HIGH (ref 32.0–48.0)
pO2, Arterial: 61 mmHg — ABNORMAL LOW (ref 83.0–108.0)

## 2018-12-12 LAB — CBC WITH DIFFERENTIAL/PLATELET
ABS IMMATURE GRANULOCYTES: 0.01 10*3/uL (ref 0.00–0.07)
Basophils Absolute: 0 10*3/uL (ref 0.0–0.1)
Basophils Relative: 1 %
Eosinophils Absolute: 0.2 10*3/uL (ref 0.0–0.5)
Eosinophils Relative: 4 %
HCT: 45.9 % (ref 39.0–52.0)
Hemoglobin: 14.3 g/dL (ref 13.0–17.0)
Immature Granulocytes: 0 %
Lymphocytes Relative: 30 %
Lymphs Abs: 1.6 10*3/uL (ref 0.7–4.0)
MCH: 28.5 pg (ref 26.0–34.0)
MCHC: 31.2 g/dL (ref 30.0–36.0)
MCV: 91.4 fL (ref 80.0–100.0)
Monocytes Absolute: 0.7 10*3/uL (ref 0.1–1.0)
Monocytes Relative: 13 %
Neutro Abs: 2.7 10*3/uL (ref 1.7–7.7)
Neutrophils Relative %: 52 %
Platelets: 318 10*3/uL (ref 150–400)
RBC: 5.02 MIL/uL (ref 4.22–5.81)
RDW: 13.2 % (ref 11.5–15.5)
WBC: 5.2 10*3/uL (ref 4.0–10.5)
nRBC: 0 % (ref 0.0–0.2)

## 2018-12-12 LAB — CBG MONITORING, ED: Glucose-Capillary: 86 mg/dL (ref 70–99)

## 2018-12-12 LAB — I-STAT CREATININE, ED: Creatinine, Ser: 1 mg/dL (ref 0.61–1.24)

## 2018-12-12 MED ORDER — FLUTICASONE PROPIONATE 50 MCG/ACT NA SUSP: 2.0000 | Freq: Every day | NASAL | 0 refills | Status: DC

## 2018-12-12 MED ORDER — GABAPENTIN 100 MG PO CAPS: 100.0000 mg | ORAL_CAPSULE | Freq: Three times a day (TID) | ORAL | 0 refills | Status: DC

## 2018-12-12 NOTE — ED Triage Notes (Signed)
Pt was here yesterday and left. Pt reports that he has had bilateral hand pain and intermittent hand numbness and tingling for 4-5 days. Pt reports that this morning he noticed some swelling to his hands. Pt also reports that he has been having neck pains for about a month.

## 2018-12-12 NOTE — ED Notes (Signed)
Pt given discharge instructions and follow up information. Pt verbalized understanding and given the opportunity to answer questions.

## 2018-12-12 NOTE — Care Management (Signed)
ED CM received consult concerning equipment. ED CM met with patient in the ED last night and he reported the power cord for CPAP was lost, so he was unable to use it. Patient states he contacted [facility] concerning and they were unable to assist him.  CM explained he could use any of the universal 3 prong power cords from another appliance. CM contacted [facility] 336 [phone] today concerning patient's CPAP. CM was informed that RT can not go out until Monday 2/1 to troubleshoot.  Updated Dr. Eulis Foster, patient was also made aware and contact information updated.

## 2018-12-12 NOTE — Progress Notes (Signed)
ABG results given to Mancel Bale, MD. RBV no new orders at this time.

## 2018-12-12 NOTE — Discharge Instructions (Signed)
Home health, will come to your home to evaluate and fix your CPAP machine, on 12/14/2018.  We sent prescriptions to your pharmacy to help with your discomfort.  Try to stop smoking cigarettes.  Return here, if needed, for problems.

## 2018-12-12 NOTE — ED Notes (Signed)
Pt's SpO2 decreased to the 70's while patient is asleep. Pt placed on 2L [place] by this RN.

## 2018-12-12 NOTE — ED Provider Notes (Signed)
Darryl Moody Provider Note   CSN: [id number] Arrival date & time: 12/12/18  4132     History   Chief Complaint Chief Complaint  Patient presents with  . Results    HPI Darryl Moody is a 38 y.o. male.  HPI   He presents for evaluation of a tingling sensation in both hands.  He states this is been going on for several days.  He denies tingling in his feet although a male person with him states he did complain of that.  He is also complaining of swelling in his hands and has had some neck pain recently.  Patient was evaluated at the urgent care yesterday, sent to the ED where he was evaluated here.  He chose to leave, AMA, and not stay for admission which was planned.  He states that he had to leave because he had childcare issues.  He has sleep apnea has been prescribed CPAP, but has not used it in several years.  He states that he is willing to use it but he is having trouble obtaining a power cord for the machine.  He smokes cigarettes.  He admits to having some nasal congestion, but denies producing much mucus from his nose.  He denies cough, shortness of breath, weakness, dizziness, nausea or vomiting.  Patient was evaluated here yesterday, comprehensively with blood work indicating, chronic with normal pH.  Chest x-ray was normal.  There are no other known modifying factors.  Past Medical History:  Diagnosis Date  . Narcolepsy   . Obesity   . Sleep apnea    does use cpap    Patient Active Problem List   Diagnosis Date Noted  . Polysubstance abuse (HCC)   . Hypoxemia   . Narcolepsy 08/10/2015  . Acute encephalopathy 07/27/2015  . Respiratory failure (HCC) 07/27/2015  . Acute on chronic respiratory failure (HCC)   . Obstructive sleep apnea     Past Surgical History:  Procedure Laterality Date  . MULTIPLE EXTRACTIONS WITH ALVEOLOPLASTY N/A 07/24/2017   Procedure: attempted MULTIPLE EXTRACTION WITH ALVEOLOPLASTY;  Surgeon: Ocie Doyne, DDS;  Location: [facility] OR;  Service: Oral Surgery;  Laterality: N/A;  . NO PAST SURGERIES          Home Medications    Prior to Admission medications   Medication Sig Start Date End Date Taking? Authorizing Provider  fluticasone (FLONASE) 50 MCG/ACT nasal spray Place 2 sprays into both nostrils daily. 12/12/18   Mancel Bale, MD  gabapentin (NEURONTIN) 100 MG capsule Take 1 capsule (100 mg total) by mouth 3 (three) times daily. 12/12/18   Mancel Bale, MD  naproxen (NAPROSYN) 500 MG tablet Take 1 tablet (500 mg total) by mouth 2 (two) times daily as needed. 03/08/18   Ward, Chase Picket, PA-C    Family History Family History  Problem Relation Age of Onset  . Hypertension Other     Social History Social History   Tobacco Use  . Smoking status: Current Some Day Smoker    Packs/day: 0.50    Types: Cigarettes  . Smokeless tobacco: Never Used  Substance Use Topics  . Alcohol use: Not Currently    Alcohol/week: 0.0 standard drinks  . Drug use: Not Currently    Types: Cocaine    Comment: wife states no longer using cocaine for a couple of months as of 07/23/17     Allergies   Hydrocodone-acetaminophen and Shrimp [shellfish allergy]   Review of Systems Review of Systems  All other systems reviewed and are negative.    Physical Exam Updated Vital Signs BP 99/86   Pulse (!) 104   Temp 98.7 F (37.1 C) (Oral)   Resp (!) 22   Ht 5\' 10"  (1.778 m)   Wt (!) 145.2 kg   SpO2 96%   BMI 45.92 kg/m   Physical Exam Vitals signs and nursing note reviewed.  Constitutional:      General: He is not in acute distress.    Appearance: He is well-developed. He is obese. He is not ill-appearing, toxic-appearing or diaphoretic.     Comments: Morbidly obese  HENT:     Head: Normocephalic and atraumatic.     Right Ear: External ear normal.     Left Ear: External ear normal.     Nose:     Comments: Nasal tone to speak in voice, consistent with inflamed adenoids.     Mouth/Throat:     Mouth: Mucous membranes are moist.     Pharynx: Oropharynx is clear. No oropharyngeal exudate or posterior oropharyngeal erythema.     Comments: Oral airway intact without stridor. Eyes:     Conjunctiva/sclera: Conjunctivae normal.     Pupils: Pupils are equal, round, and reactive to light.  Neck:     Musculoskeletal: Normal range of motion and neck supple.     Trachea: Phonation normal.  Cardiovascular:     Rate and Rhythm: Normal rate and regular rhythm.     Heart sounds: Normal heart sounds.  Pulmonary:     Effort: Pulmonary effort is normal. No respiratory distress.     Breath sounds: Normal breath sounds. No stridor. No rhonchi.  Chest:     Chest wall: No tenderness.  Abdominal:     Palpations: Abdomen is soft.     Tenderness: There is no abdominal tenderness.  Musculoskeletal: Normal range of motion.        General: No swelling or tenderness.  Skin:    General: Skin is warm and dry.  Neurological:     Mental Status: He is alert and oriented to person, place, and time.     Cranial Nerves: No cranial nerve deficit.     Sensory: No sensory deficit.     Motor: No abnormal muscle tone.     Coordination: Coordination normal.  Psychiatric:        Behavior: Behavior normal.        Thought Content: Thought content normal.        Judgment: Judgment normal.      ED Treatments / Results  Labs (all labs ordered are listed, but only abnormal results are displayed) Labs Reviewed  POCT I-STAT 7, (LYTES, BLD GAS, ICA,H+H) - Abnormal; Notable for the following components:      Result Value   pH, Arterial 7.315 (*)    pCO2 arterial 54.3 (*)    pO2, Arterial 61.0 (*)    All other components within normal limits  CBC WITH DIFFERENTIAL/PLATELET  I-STAT CREATININE, ED  CBG MONITORING, ED    EKG None  Radiology Dg Chest Port 1 View  Result Date: 12/11/2018 CLINICAL DATA:  Increased SOB today. Unable to wake up pt for imaging, no further history available  EXAM: PORTABLE CHEST 1 VIEW COMPARISON:  09/01/2018 FINDINGS: Cardiac silhouette is enlarged.  No mediastinal or hilar masses. Lungs clear.  No convincing pleural effusion.  No pneumothorax. Skeletal structures are grossly intact. IMPRESSION: 1. No acute cardiopulmonary disease. 2. Mild cardiomegaly. Electronically Signed   By: Onalee Hua  Ormond M.D.   On: 12/11/2018 20:33    Procedures .Critical Care Performed by: Mancel BaleWentz, Lind Ausley, MD Authorized by: Mancel BaleWentz, Nicolas Banh, MD   Critical care provider statement:    Critical care time (minutes):  35   Critical care start time:  12/12/2018 8:10 AM   Critical care end time:  12/12/2018 11:30 AM   Critical care time was exclusive of:  Separately billable procedures and treating other patients   Critical care was necessary to treat or prevent imminent or life-threatening deterioration of the following conditions:  Respiratory failure   Critical care was time spent personally by me on the following activities:  Blood draw for specimens, development of treatment plan with patient or surrogate, discussions with consultants, evaluation of patient's response to treatment, examination of patient, obtaining history from patient or surrogate, ordering and performing treatments and interventions, ordering and review of laboratory studies, pulse oximetry, re-evaluation of patient's condition, review of old charts and ordering and review of radiographic studies   (including critical care time)  Medications Ordered in ED Medications - No data to display   Initial Impression / Assessment and Plan / ED Course  I have reviewed the triage vital signs and the nursing notes.  Pertinent labs & imaging results that were available during my care of the patient were reviewed by me and considered in my medical decision making (see chart for details).  Clinical Course as of Dec 12 1126  Sat Dec 12, 2018  0830 Patient asked for something to drink, a few minutes ago.  At this time he  is soundly asleep and difficult to arouse.  Oxygen saturation 98% on room air oxygen.   [EW]  0830 Chart review indicates chronic noncompliance with recommendations for care and hospitalization.  Last saw pulmonary, about 1 year ago at that time they were attempting to have home health services reinitiate his CPAP treatment.   [EW]  0831 Since he continues to show altered mental status, will reevaluate again with arterial blood gas, and probably seek admission.    [EW]  0900 Abnormal, pH slightly low 7.3, PCO2 elevated 54, PO2 low 61, oxygen saturation low 88%.  Remainder normal with bicarbonate 28.  I-STAT 7, (LYTES, BLD GAS, ICA, H+H)(!) [EW]  1054 Normal  I-stat Creatinine, ED [EW]  1055 Normal  CBC with Differential [EW]  1055 He has been evaluated by case management, who has arranged to have advanced home health services evaluate the patient's CPAP machine, at his home, for repair or replacement, on 12/14/2018.   [EW]  1055 Normal on oxygen supplementation at 2 L per nasal cannula.  SpO2: 95 % [EW]    Clinical Course User Index [EW] Mancel BaleWentz, Akyah Lagrange, MD     Patient Vitals for the past 24 hrs:  BP Temp Temp src Pulse Resp SpO2 Height Weight  12/12/18 1030 99/86 - - (!) 104 (!) 22 96 % - -  12/12/18 1027 99/86 - - (!) 106 (!) 24 95 % - -  12/12/18 0832 129/78 - - 66 - 96 % - -  12/12/18 0813 - - - - - - 5\' 10"  (1.778 m) (!) 145.2 kg  12/12/18 0804 (!) 129/101 98.7 F (37.1 C) Oral (!) 107 20 96 % - -    10:59 AM Reevaluation with update and discussion. After initial assessment and treatment, an updated evaluation reveals at this time the patient is kneeling on the floor with his head on the bed, awake and conversant.  He complains of pain  and numbness in both hands.  Findings were discussed with him.  He is hypoxic when sleeping, and he was offered admission, since he does not have oxygen at home.  He declined hospitalization at this time.  I informed him that home health services will  evaluate his CPAP machine in 2 days, and that we can prescribe medication to help some of his symptoms, including Flonase, and gabapentin.  Findings were discussed with the patient and his male friend who remains with him.  He is agreeable to prescriptions and will pick them up from the pharmacy.  Findings discussed and questions answered. Mancel BaleElliott Elley Harp   Medical Decision Making: Sleep apnea, with periods of hypoxia.  Screening arterial blood gas is reassuring.  Patient has periods of hypoxia with sleeping, which can be treated with CPAP, which he currently has at home.  He does have device problems, but is refusing hospitalization at this time.  He is lucid and capacity to choose to leave.  He will be given prescriptions for Flonase to help with nasal congestion, and gabapentin as possible assistance with his paresthesia in both hands.  Doubt CVA, ACS, PE or pneumonia.  CRITICAL CARE-yes Performed by: Mancel BaleElliott Preciosa Bundrick  Nursing Notes Reviewed/ Care Coordinated Applicable Imaging Reviewed Interpretation of Laboratory Data incorporated into ED treatment  The patient appears reasonably screened and/or stabilized for discharge and I doubt any other medical condition or other [facility] requiring further screening, evaluation, or treatment in the ED at this time prior to discharge.  Plan: Home Medications-continue usual; Home Treatments-CPAP at home as prescribed, stop smoking cigarettes; return here if the recommended treatment, does not improve the symptoms; Recommended follow up-PCP and pulmonary as needed.   Final Clinical Impressions(s) / ED Diagnoses   Final diagnoses:  Sleep apnea, unspecified type  Paresthesia  Tobacco abuse    ED Discharge Orders         Ordered    gabapentin (NEURONTIN) 100 MG capsule  3 times daily     12/12/18 1121    fluticasone (FLONASE) 50 MCG/ACT nasal spray  Daily     12/12/18 1121           Mancel BaleWentz, Darnice Comrie, MD 12/12/18 1130

## 2018-12-28 ENCOUNTER — Telehealth: Payer: Self-pay

## 2018-12-28 NOTE — Telephone Encounter (Signed)
Luciano Cutter, MD  P [facility] Triage Pool        Please contact patient and offer to schedule follow-up for his CPAP with me or NP   JE    Call made to patient, Appt made. Nothing further is needed at this time.

## 2019-01-05 ENCOUNTER — Encounter: Payer: Self-pay | Admitting: Pulmonary Disease

## 2019-01-05 ENCOUNTER — Telehealth: Payer: Self-pay | Admitting: Pulmonary Disease

## 2019-01-05 ENCOUNTER — Ambulatory Visit (INDEPENDENT_AMBULATORY_CARE_PROVIDER_SITE_OTHER): Payer: Medicaid Other | Admitting: Pulmonary Disease

## 2019-01-05 VITALS — BP 132/80 | HR 94 | Ht 70.0 in | Wt 318.8 lb

## 2019-01-05 DIAGNOSIS — G4730 Sleep apnea, unspecified: Secondary | ICD-10-CM

## 2019-01-05 NOTE — Telephone Encounter (Signed)
She also stated that the patient's current settings were 12/15 for his bipap.

## 2019-01-05 NOTE — Telephone Encounter (Signed)
Order pended. Dr. Ranae Palms, did you want to wait until you saw this patient in clinic tomorrow to determine what settings he needs to be on? Please advise.

## 2019-01-05 NOTE — Telephone Encounter (Signed)
Noted. Will close this encounter.  

## 2019-01-05 NOTE — Telephone Encounter (Signed)
Please change settings to IPAP 19 and EPAP15. Please also note, patient scheduled to see sleep physician tomorrow and may change settings after evaluation.

## 2019-01-05 NOTE — Telephone Encounter (Signed)
Spoke with Boneta Lucks. She stated that the patient is not eligible for a new machine until 2021. He received his current machine in 2016. Patient is already on a bipap and she would be happy to do a pressure setting change.   As far as the power cord, they were notified by the patient and they called him on 2/11 to let him know that they had a power cord at the [facility] store for him to come back and pick up.   Will route to Dr. Everardo All so she is aware.

## 2019-01-05 NOTE — Progress Notes (Signed)
Subjective:   PATIENT ID: Darryl Moody GENDER: male DOB: 1981/03/07, MRN: [id number]   HPI  Chief Complaint  Patient presents with  . Follow-up    He has not been using CPAP due to not having the correct power cord. He c/o feeling tired during the day.     Reason for Visit: ED follow-up for sleep apnea  Mr. Aidon Hergenrader is a 38 year old male active smoker with very severe obstructive sleep apnea, narcolepsy, history of polysubstance abuse who presents ED follow-up visit as a new pulmonary patient.  History probably obtained by girlfriend.  He initially presented to the ED for bilateral hand paresthesia and lethargy.  His partner reports difficulty controlling his sleep apnea at that time. He has not been on CPAP for at least over a year.  The power cord for his CPAP has been missing for some time.  He is chronically falling asleep with decreased responsiveness and often difficult to arouse but will usually awaken to her voice and/or external stimulation.  When able to respond patient states he has not slept in a long time.  Feels tired.  Social History: Active smoker.  13 pack years.  Currently smoking half a pack per day  Environmental exposures: No known chemical, raw material or environmental exposures  I have personally reviewed patient's past medical/family/social history, allergies, current medications.  Past Medical History:  Diagnosis Date  . Narcolepsy   . Obesity   . Sleep apnea    does use cpap     Family History  Problem Relation Age of Onset  . Hypertension Other      Social History   Occupational History  . Not on file  Tobacco Use  . Smoking status: Current Some Day Smoker    Packs/day: 0.50    Years: 26.00    Pack years: 13.00    Types: Cigarettes  . Smokeless tobacco: Never Used  Substance and Sexual Activity  . Alcohol use: Not Currently    Alcohol/week: 0.0 standard drinks  . Drug use: Not Currently    Types: Cocaine    Comment: wife  states no longer using cocaine for a couple of months as of 07/23/17  . Sexual activity: Not on file    Allergies  Allergen Reactions  . Hydrocodone-Acetaminophen Anaphylaxis  . Shrimp [Shellfish Allergy] Swelling and Other (See Comments)    Throat swells     Outpatient Medications Prior to Visit  Medication Sig Dispense Refill  . fluticasone (FLONASE) 50 MCG/ACT nasal spray Place 2 sprays into both nostrils daily. 16 g 0  . gabapentin (NEURONTIN) 100 MG capsule Take 1 capsule (100 mg total) by mouth 3 (three) times daily. 90 capsule 0  . naproxen (NAPROSYN) 500 MG tablet Take 1 tablet (500 mg total) by mouth 2 (two) times daily as needed. 30 tablet 0   No facility-administered medications prior to visit.     Review of Systems  Unable to perform ROS: Mental status change     Objective:   Vitals:   01/05/19 0940  BP: 132/80  Pulse: 94  SpO2: 92%  Weight: (!) 318 lb 12.8 oz (144.6 kg)  Height: 5\' 10"  (1.778 m)   SpO2: 92 % O2 Device: None (Room air)  Physical Exam: General: Morbidly obese, lethargic, difficult to awaken with sternal rub  HENT: [place], AT Eyes: EOMI, conjunctival injection Respiratory: Clear to auscultation bilaterally.  No crackles, wheezing or rales Cardiovascular: RRR, -M/R/G, no JVD GI: BS+, soft, nontender  Extremities:-Edema,-tenderness Neuro: AAO x4, CNII-XII grossly intact Skin: Intact, no rashes or bruising Psych: Normal mood, normal affect  Data Reviewed:  Imaging: CXR 12/11/2018-low lung volumes enlarged cardiac silhouette, bibasilar atelectasis  PFT: None on file  Labs: ABG 12/12/2018-7.315/50 4/61  Sleep study: Split-night 09/01/2015-AHI 75.8.  Patient could not tolerate CPAP and was changed to BiPAP.  Inspiratory 19 and expiratory 15 with room air saturation 85.8%  Imaging, labs and tests noted above have been reviewed independently by me.    Assessment & Plan:   Discussion: 38 year old male with morbid obesity, chronic  hypercarbic respiratory failure and very severe sleep apnea who presents with uncontrolled sleep apnea.    Severe Obstructive Sleep Apnea Difficult to awaken due to excessive sleepiness in office. EMS called however patient awakened and declined emergency services so call was cancelled. Prior to leaving office, he was awake and appropriate. His wife is his driver. -Referral to Sleep Clinic asap -Staff will contact [facility] for BiPAP order  -Per sleep study: IPAP 19 EPAP 15.  Will need overnight pulse oximetry to monitor oxygen saturations on the settings as patient will likely need supplemental oxygen -Patient and wife plan to obtain appropriate power cord from DME for CPAP  I strongly advised patient against driving until sleep apnea has been controlled I strongly advised compliance with CPAP therapy at least >4 hours daily. Recommend using when sleeping and naps. I strongly advised to patient and wife to present to ED for worsening mental status and/or difficulty awakening for evaluation as I am concerned for development of acute hypercarbic and hypoxemic respiratory failure.  Patient and wife expresses understanding to the above instructions.  Orders Placed This Encounter  Procedures  . Ambulatory Referral for DME    Referral Priority:   Urgent    Referral Type:   Durable Medical Equipment Purchase    Number of Visits Requested:   1  No orders of the defined types were placed in this encounter.   Return in about 3 months (around 04/05/2019).  Henry Utsey Mechele Collin, MD [facility] 01/05/2019 12:48 PM  Office Number [phone]

## 2019-01-05 NOTE — Patient Instructions (Signed)
Severe Obstructive Sleep Apnea Difficult to awaken due to excessive sleepiness in office. EMS called however patient awakened and declined emergency services so call was cancelled. Prior to leaving office, he was awake and appropriate. His wife is his driver. -Referral to Sleep Clinic asap -Staff will contact [facility] for BiPAP order -Patient and wife plan to obtain appropriate power cord from DME for CPAP  I strongly advised patient against driving until sleep apnea has been controlled I strongly advised compliance with CPAP therapy at least >4 hours daily. Recommend using when sleeping and naps. I strongly advised to patient and wife to present to ED for worsening mental status and/or difficulty awakening for evaluation as I am concerned for development of acute hypercarbic and hypoxemic respiratory failure.  Patient and wife expresses understanding to the above instructions.

## 2019-01-05 NOTE — Telephone Encounter (Signed)
Darryl Moody, [facility], is returning call. Cb is [phone]

## 2019-01-05 NOTE — Telephone Encounter (Signed)
Darryl Moody, [facility], is returning call. Cb is [phone]

## 2019-01-05 NOTE — Telephone Encounter (Signed)
Will address when seen tomorrow

## 2019-01-05 NOTE — Telephone Encounter (Addendum)
Called [facility] and LM for Darryl Moody at ext [phone]  Dr Everardo All wanted to know the pt's current CPAP settings  I also wanted to inquire about getting the pt the correct power cord for his CPAP since he says that he does not have one    Late add: we went ahead and ordered BIPAP for this pt and this needs to be given to him STAT  Will update Darryl Moody when she calls back

## 2019-01-06 ENCOUNTER — Institutional Professional Consult (permissible substitution): Payer: Medicaid Other | Admitting: Pulmonary Disease

## 2019-01-25 ENCOUNTER — Telehealth: Payer: Self-pay | Admitting: Pulmonary Disease

## 2019-01-25 NOTE — Telephone Encounter (Signed)
lmtcb x1 pt 

## 2019-01-25 NOTE — Telephone Encounter (Signed)
Did not show up for his scheduled appointment  Does he have a rescheduled appointment or is he going to follow-up with Dr. Everardo All?
